# Patient Record
Sex: Female | Born: 1970 | Race: White | Hispanic: No | Smoking: Former smoker
Health system: Southern US, Community
[De-identification: ages and names within clinical notes are randomized; demographics above are authoritative.]

## PROBLEM LIST (undated history)

## (undated) DIAGNOSIS — R87619 Unspecified abnormal cytological findings in specimens from cervix uteri: Secondary | ICD-10-CM

## (undated) DIAGNOSIS — IMO0002 Reserved for concepts with insufficient information to code with codable children: Secondary | ICD-10-CM

## (undated) DIAGNOSIS — N809 Endometriosis, unspecified: Secondary | ICD-10-CM

## (undated) DIAGNOSIS — Z8619 Personal history of other infectious and parasitic diseases: Secondary | ICD-10-CM

## (undated) DIAGNOSIS — Z87442 Personal history of urinary calculi: Secondary | ICD-10-CM

## (undated) DIAGNOSIS — D332 Benign neoplasm of brain, unspecified: Secondary | ICD-10-CM

## (undated) DIAGNOSIS — B009 Herpesviral infection, unspecified: Secondary | ICD-10-CM

## (undated) DIAGNOSIS — R51 Headache: Secondary | ICD-10-CM

## (undated) DIAGNOSIS — T8339XA Other mechanical complication of intrauterine contraceptive device, initial encounter: Secondary | ICD-10-CM

## (undated) HISTORY — DX: Headache: R51

## (undated) HISTORY — DX: Other mechanical complication of intrauterine contraceptive device, initial encounter: T83.39XA

## (undated) HISTORY — PX: BRAIN SURGERY: SHX531

## (undated) HISTORY — PX: WISDOM TOOTH EXTRACTION: SHX21

## (undated) HISTORY — DX: Herpesviral infection, unspecified: B00.9

## (undated) HISTORY — DX: Personal history of urinary calculi: Z87.442

## (undated) HISTORY — PX: APPENDECTOMY: SHX54

## (undated) HISTORY — DX: Endometriosis, unspecified: N80.9

## (undated) HISTORY — DX: Personal history of other infectious and parasitic diseases: Z86.19

## (undated) HISTORY — DX: Reserved for concepts with insufficient information to code with codable children: IMO0002

## (undated) HISTORY — PX: LAPAROTOMY: SHX154

## (undated) HISTORY — DX: Unspecified abnormal cytological findings in specimens from cervix uteri: R87.619

---

## 2003-05-18 ENCOUNTER — Emergency Department (HOSPITAL_COMMUNITY): Admission: EM | Admit: 2003-05-18 | Discharge: 2003-05-18 | Payer: Self-pay | Admitting: Emergency Medicine

## 2003-08-19 ENCOUNTER — Other Ambulatory Visit: Admission: RE | Admit: 2003-08-19 | Discharge: 2003-08-19 | Payer: Self-pay | Admitting: Obstetrics and Gynecology

## 2004-08-19 ENCOUNTER — Other Ambulatory Visit: Admission: RE | Admit: 2004-08-19 | Discharge: 2004-08-19 | Payer: Self-pay | Admitting: Obstetrics and Gynecology

## 2004-11-28 ENCOUNTER — Emergency Department (HOSPITAL_COMMUNITY): Admission: EM | Admit: 2004-11-28 | Discharge: 2004-11-28 | Payer: Self-pay | Admitting: Emergency Medicine

## 2005-08-20 ENCOUNTER — Other Ambulatory Visit: Admission: RE | Admit: 2005-08-20 | Discharge: 2005-08-20 | Payer: Self-pay | Admitting: Obstetrics and Gynecology

## 2006-03-30 ENCOUNTER — Emergency Department (HOSPITAL_COMMUNITY): Admission: EM | Admit: 2006-03-30 | Discharge: 2006-03-31 | Payer: Self-pay | Admitting: Emergency Medicine

## 2006-03-31 ENCOUNTER — Emergency Department (HOSPITAL_COMMUNITY): Admission: EM | Admit: 2006-03-31 | Discharge: 2006-03-31 | Payer: Self-pay | Admitting: Emergency Medicine

## 2006-03-31 ENCOUNTER — Inpatient Hospital Stay (HOSPITAL_COMMUNITY): Admission: EM | Admit: 2006-03-31 | Discharge: 2006-04-01 | Payer: Self-pay | Admitting: Emergency Medicine

## 2006-04-04 ENCOUNTER — Ambulatory Visit (HOSPITAL_COMMUNITY): Admission: RE | Admit: 2006-04-04 | Discharge: 2006-04-04 | Payer: Self-pay | Admitting: Urology

## 2007-09-13 ENCOUNTER — Emergency Department (HOSPITAL_COMMUNITY): Admission: EM | Admit: 2007-09-13 | Discharge: 2007-09-13 | Payer: Self-pay | Admitting: Emergency Medicine

## 2008-02-29 ENCOUNTER — Ambulatory Visit (HOSPITAL_COMMUNITY): Admission: RE | Admit: 2008-02-29 | Discharge: 2008-02-29 | Payer: Self-pay | Admitting: Sports Medicine

## 2008-12-25 ENCOUNTER — Emergency Department (HOSPITAL_BASED_OUTPATIENT_CLINIC_OR_DEPARTMENT_OTHER): Admission: EM | Admit: 2008-12-25 | Discharge: 2008-12-25 | Payer: Self-pay | Admitting: Emergency Medicine

## 2008-12-25 ENCOUNTER — Ambulatory Visit: Payer: Self-pay | Admitting: Diagnostic Radiology

## 2008-12-29 ENCOUNTER — Encounter: Admission: RE | Admit: 2008-12-29 | Discharge: 2008-12-29 | Payer: Self-pay | Admitting: Family Medicine

## 2009-05-13 ENCOUNTER — Observation Stay (HOSPITAL_COMMUNITY): Admission: EM | Admit: 2009-05-13 | Discharge: 2009-05-15 | Payer: Self-pay | Admitting: Emergency Medicine

## 2009-05-15 ENCOUNTER — Encounter (INDEPENDENT_AMBULATORY_CARE_PROVIDER_SITE_OTHER): Payer: Self-pay | Admitting: Internal Medicine

## 2009-05-20 ENCOUNTER — Encounter: Admission: RE | Admit: 2009-05-20 | Discharge: 2009-05-20 | Payer: Self-pay | Admitting: Internal Medicine

## 2009-05-26 ENCOUNTER — Encounter: Admission: RE | Admit: 2009-05-26 | Discharge: 2009-08-24 | Payer: Self-pay | Admitting: Internal Medicine

## 2010-04-01 ENCOUNTER — Ambulatory Visit: Payer: Self-pay | Admitting: Licensed Clinical Social Worker

## 2010-04-07 ENCOUNTER — Ambulatory Visit: Payer: Self-pay | Admitting: Licensed Clinical Social Worker

## 2010-04-28 ENCOUNTER — Ambulatory Visit: Payer: Self-pay | Admitting: Licensed Clinical Social Worker

## 2010-05-05 ENCOUNTER — Ambulatory Visit: Payer: Self-pay | Admitting: Licensed Clinical Social Worker

## 2010-05-28 ENCOUNTER — Ambulatory Visit: Admit: 2010-05-28 | Payer: Self-pay | Admitting: Licensed Clinical Social Worker

## 2010-08-24 LAB — URINALYSIS, ROUTINE W REFLEX MICROSCOPIC
Bilirubin Urine: NEGATIVE
Glucose, UA: NEGATIVE mg/dL
Hgb urine dipstick: NEGATIVE
Ketones, ur: NEGATIVE mg/dL
Nitrite: NEGATIVE
Protein, ur: NEGATIVE mg/dL
Specific Gravity, Urine: 1.01 (ref 1.005–1.030)
Urobilinogen, UA: 0.2 mg/dL (ref 0.0–1.0)
pH: 6.5 (ref 5.0–8.0)

## 2010-08-24 LAB — GLUCOSE, CAPILLARY: Glucose-Capillary: 92 mg/dL (ref 70–99)

## 2010-08-24 LAB — CBC
HCT: 38.1 % (ref 36.0–46.0)
HCT: 40.6 % (ref 36.0–46.0)
Hemoglobin: 13.1 g/dL (ref 12.0–15.0)
Hemoglobin: 14.2 g/dL (ref 12.0–15.0)
MCHC: 34.2 g/dL (ref 30.0–36.0)
MCHC: 34.9 g/dL (ref 30.0–36.0)
MCHC: 34.9 g/dL (ref 30.0–36.0)
MCV: 94 fL (ref 78.0–100.0)
MCV: 94.3 fL (ref 78.0–100.0)
MCV: 95.7 fL (ref 78.0–100.0)
Platelets: 240 10*3/uL (ref 150–400)
Platelets: 244 10*3/uL (ref 150–400)
Platelets: 255 10*3/uL (ref 150–400)
RBC: 4.05 MIL/uL (ref 3.87–5.11)
RBC: 4.05 MIL/uL (ref 3.87–5.11)
RBC: 4.24 MIL/uL (ref 3.87–5.11)
RDW: 11.4 % — ABNORMAL LOW (ref 11.5–15.5)
RDW: 11.8 % (ref 11.5–15.5)
RDW: 12.1 % (ref 11.5–15.5)
WBC: 5 10*3/uL (ref 4.0–10.5)
WBC: 5.6 10*3/uL (ref 4.0–10.5)

## 2010-08-24 LAB — VITAMIN B12: Vitamin B-12: 514 pg/mL (ref 211–911)

## 2010-08-24 LAB — COMPREHENSIVE METABOLIC PANEL
ALT: 19 U/L (ref 0–35)
AST: 18 U/L (ref 0–37)
Albumin: 4 g/dL (ref 3.5–5.2)
Alkaline Phosphatase: 49 U/L (ref 39–117)
BUN: 9 mg/dL (ref 6–23)
CO2: 21 mEq/L (ref 19–32)
Calcium: 8.9 mg/dL (ref 8.4–10.5)
Chloride: 112 mEq/L (ref 96–112)
Creatinine, Ser: 0.58 mg/dL (ref 0.4–1.2)
GFR calc Af Amer: >60 mL/min (ref >60)
GFR calc non Af Amer: >60 mL/min (ref >60)
Glucose, Bld: 125 mg/dL — ABNORMAL HIGH (ref 70–99)
Potassium: 3.3 mEq/L — ABNORMAL LOW (ref 3.5–5.1)
Sodium: 138 mEq/L (ref 135–145)
Total Bilirubin: 1 mg/dL (ref 0.3–1.2)
Total Protein: 6.4 g/dL (ref 6.0–8.3)

## 2010-08-24 LAB — DIFFERENTIAL
Basophils Absolute: 0.1 10*3/uL (ref 0.0–0.1)
Basophils Relative: 1 % (ref 0–1)
Eosinophils Absolute: 0.2 10*3/uL (ref 0.0–0.7)
Eosinophils Relative: 3 % (ref 0–5)
Lymphocytes Relative: 42 % (ref 12–46)
Lymphs Abs: 2.3 10*3/uL (ref 0.7–4.0)
Monocytes Absolute: 0.4 10*3/uL (ref 0.1–1.0)
Monocytes Relative: 6 % (ref 3–12)
Neutro Abs: 2.6 10*3/uL (ref 1.7–7.7)
Neutrophils Relative %: 48 % (ref 43–77)

## 2010-08-24 LAB — PROTIME-INR
INR: 1.11 (ref 0.00–1.49)
Prothrombin Time: 14.2 seconds (ref 11.6–15.2)

## 2010-08-24 LAB — BASIC METABOLIC PANEL
BUN: 11 mg/dL (ref 6–23)
BUN: 9 mg/dL (ref 6–23)
CO2: 20 mEq/L (ref 19–32)
CO2: 21 mEq/L (ref 19–32)
Calcium: 8.5 mg/dL (ref 8.4–10.5)
Chloride: 109 mEq/L (ref 96–112)
Chloride: 110 mEq/L (ref 96–112)
Creatinine, Ser: 0.59 mg/dL (ref 0.4–1.2)
Creatinine, Ser: 0.81 mg/dL (ref 0.4–1.2)
GFR calc Af Amer: >60 mL/min (ref >60)
GFR calc non Af Amer: >60 mL/min (ref >60)
Glucose, Bld: 91 mg/dL (ref 70–99)
Glucose, Bld: 98 mg/dL (ref 70–99)
Potassium: 3.5 mEq/L (ref 3.5–5.1)
Sodium: 136 mEq/L (ref 135–145)

## 2010-08-24 LAB — URINE CULTURE
Colony Count: NO GROWTH
Culture: NO GROWTH
Special Requests: NEGATIVE

## 2010-08-24 LAB — CK TOTAL AND CKMB (NOT AT ARMC)
CK, MB: 0.4 ng/mL (ref 0.3–4.0)
Relative Index: INVALID (ref 0.0–2.5)
Total CK: 73 U/L (ref 7–177)

## 2010-08-24 LAB — APTT: aPTT: 28 seconds (ref 24–37)

## 2010-08-24 LAB — TROPONIN I: Troponin I: 0.01 ng/mL (ref 0.00–0.06)

## 2010-08-29 LAB — COMPREHENSIVE METABOLIC PANEL
ALT: 25 U/L (ref 0–35)
AST: 22 U/L (ref 0–37)
Albumin: 4.6 g/dL (ref 3.5–5.2)
Alkaline Phosphatase: 57 U/L (ref 39–117)
BUN: 15 mg/dL (ref 6–23)
CO2: 24 mEq/L (ref 19–32)
Calcium: 9.9 mg/dL (ref 8.4–10.5)
Chloride: 106 mEq/L (ref 96–112)
Creatinine, Ser: 0.7 mg/dL (ref 0.4–1.2)
GFR calc Af Amer: >60 mL/min (ref >60)
GFR calc non Af Amer: >60 mL/min (ref >60)
Glucose, Bld: 112 mg/dL — ABNORMAL HIGH (ref 70–99)
Potassium: 4 mEq/L (ref 3.5–5.1)
Sodium: 145 mEq/L (ref 135–145)
Total Bilirubin: 0.4 mg/dL (ref 0.3–1.2)
Total Protein: 7.6 g/dL (ref 6.0–8.3)

## 2010-08-29 LAB — CBC
HCT: 41.9 % (ref 36.0–46.0)
Hemoglobin: 14.5 g/dL (ref 12.0–15.0)
MCHC: 34.7 g/dL (ref 30.0–36.0)
MCV: 95.2 fL (ref 78.0–100.0)
Platelets: 296 10*3/uL (ref 150–400)
RBC: 4.4 MIL/uL (ref 3.87–5.11)
RDW: 11.4 % — ABNORMAL LOW (ref 11.5–15.5)
WBC: 7.1 10*3/uL (ref 4.0–10.5)

## 2010-08-29 LAB — URINE MICROSCOPIC-ADD ON

## 2010-08-29 LAB — URINALYSIS, ROUTINE W REFLEX MICROSCOPIC
Bilirubin Urine: NEGATIVE
Glucose, UA: NEGATIVE mg/dL
Hgb urine dipstick: NEGATIVE
Ketones, ur: NEGATIVE mg/dL
Nitrite: NEGATIVE
Protein, ur: NEGATIVE mg/dL
Specific Gravity, Urine: 1.01 (ref 1.005–1.030)
Urobilinogen, UA: 0.2 mg/dL (ref 0.0–1.0)
pH: 7 (ref 5.0–8.0)

## 2010-08-29 LAB — DIFFERENTIAL
Eosinophils Relative: 0 % (ref 0–5)
Lymphocytes Relative: 11 % — ABNORMAL LOW (ref 12–46)
Lymphs Abs: 0.8 10*3/uL (ref 0.7–4.0)
Monocytes Absolute: 0 10*3/uL — ABNORMAL LOW (ref 0.1–1.0)

## 2010-10-09 NOTE — Op Note (Signed)
Robin Bruce, Robin Bruce               ACCOUNT NO.:  [id number]   MEDICAL RECORD NO.:  [id number]           PATIENT TYPE:   LOCATION:                                 FACILITY:   PHYSICIAN:  Maretta Bees. Vonita Moss, M.D.     DATE OF BIRTH:   DATE OF PROCEDURE:  DATE OF DISCHARGE:                                 OPERATIVE REPORT   PREOPERATIVE DIAGNOSIS:  Severe right flank pain due to obstructing right  UPJ stone.   POSTOP DIAGNOSIS:  Severe right flank pain due to obstructing right UPJ  stone.   PROCEDURES:  1. Cystoscopy.  2. Right retrograde pyelogram with interpretation.  3. Right double-J catheter insertion.   SURGEON:  Maretta Bees. Vonita Moss, M.D.   ANESTHESIA:  General.   INDICATIONS:  This 40 year old lady with a past history of stone disease  presented, yesterday, at the office with severe right flank pain due to a 6-  mm stone in the right UPJ and a nonobstructing stone left kidney.  She had  to be admitted to the hospital last night for severe pain, nausea, and  vomiting.  She had a low potassium that was corrected with IV fluids.  She  is still having significant pain, today, and is scheduled for lithotripsy on  Monday, 11/12.  I discussed the options with her; and we both felt she  needed pain relief with the double-J catheter over the weekend.  I advised  her about the risk of some flank pain, hematuria, bladder pressure; and she  is brought to the OR today.   DESCRIPTION OF PROCEDURE:  The patient was brought to the operating room and  placed in the lithotomy position.  External genitalia were prepped and  draped in the usual fashion.  She was cystoscoped and bladder was normal.  I  used an 5-French open-ended ureteral catheter; and through that, inserted a  metal soft tip double-J catheter; and it would not go past the stone; so I  inserted a Glidewire through the double-J catheter, and after some twisting  of the catheter, it went past the stone into the renal collecting  system.   I then performed a right retrograde pyelogram by advancing the open-ended  ureteral catheter per cystoscope over the Glidewire into the renal  collecting system.  Slightly blood tinged urine drained out; and I performed  a retrograde pyelogram that showed pyelocaliectasis.   A metal guidewire was then placed through the open-ended ureteral catheter  into the renal collecting system; and over this guidewire a 6-French, 26-cm,  double-J catheter was passed and successfully placed with a full coil in the  upper caliceal system, and a full coil in the bladder.  At this point the  bladder was emptied, cystoscope removed; and the patient sent to the  recovery room in good condition having tolerated the procedure well.      Maretta Bees. Vonita Moss, M.D.  Electronically Signed     LJP/MEDQ  D:  04/01/2006  T:  04/01/2006  Job:  [id number]

## 2011-01-13 ENCOUNTER — Other Ambulatory Visit: Payer: Self-pay | Admitting: Obstetrics and Gynecology

## 2011-01-13 DIAGNOSIS — Z1231 Encounter for screening mammogram for malignant neoplasm of breast: Secondary | ICD-10-CM

## 2011-01-20 ENCOUNTER — Ambulatory Visit: Payer: Self-pay

## 2011-01-28 ENCOUNTER — Ambulatory Visit
Admission: RE | Admit: 2011-01-28 | Discharge: 2011-01-28 | Disposition: A | Discharge status: Home / Self Care | Payer: BC Managed Care – PPO | Source: Ambulatory Visit | Attending: Obstetrics and Gynecology | Admitting: Obstetrics and Gynecology

## 2011-01-28 DIAGNOSIS — Z1231 Encounter for screening mammogram for malignant neoplasm of breast: Secondary | ICD-10-CM

## 2011-02-16 LAB — POCT PREGNANCY, URINE
Operator id: 285841
Preg Test, Ur: NEGATIVE

## 2011-02-16 LAB — COMPREHENSIVE METABOLIC PANEL
ALT: 22
AST: 22
CO2: 24
Calcium: 9.7
Chloride: 103
GFR calc Af Amer: >60
GFR calc non Af Amer: >60
Sodium: 137
Total Bilirubin: 0.5

## 2011-02-16 LAB — URINALYSIS, ROUTINE W REFLEX MICROSCOPIC
Bilirubin Urine: NEGATIVE
Hgb urine dipstick: NEGATIVE
Ketones, ur: NEGATIVE
Nitrite: NEGATIVE
pH: 5

## 2011-02-16 LAB — CBC
RBC: 4.53
WBC: 8.6

## 2011-02-16 LAB — LIPASE, BLOOD: Lipase: 17

## 2011-02-16 LAB — DIFFERENTIAL
Eosinophils Absolute: 0.1
Eosinophils Relative: 1
Lymphs Abs: 1.2

## 2011-02-16 LAB — URINE MICROSCOPIC-ADD ON

## 2011-06-02 IMAGING — CT CT HEAD W/O CM
1 series · 16 of 30 positions shown, 20 images · non-contrast
Comparison: None

CLINICAL DATA: Dizziness.  Weakness.  Numbness of the arms and
legs.  Previous cavernous hemangioma resection.

CT HEAD WITHOUT CONTRAST
TECHNIQUE: Contiguous axial images were obtained from the base of
the skull through the vertex without contrast.

[Series 2: head 4.8 h37s · axial · 0.46mm/px · z∈[-28,+108]mm · 16 of 32 slices shown, 20 images]
[im 2/32  brain]
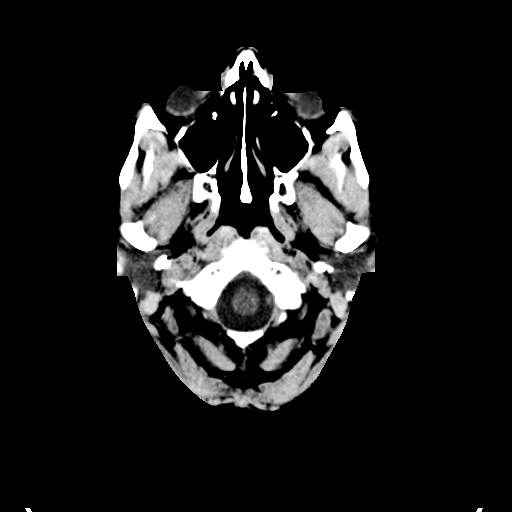
[im 2/32  bone]
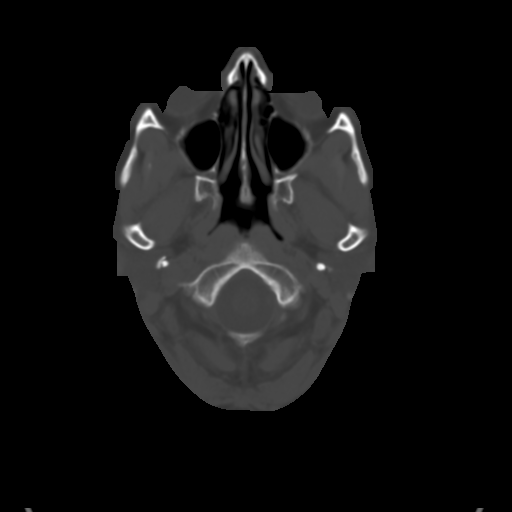
[im 4/32  brain]
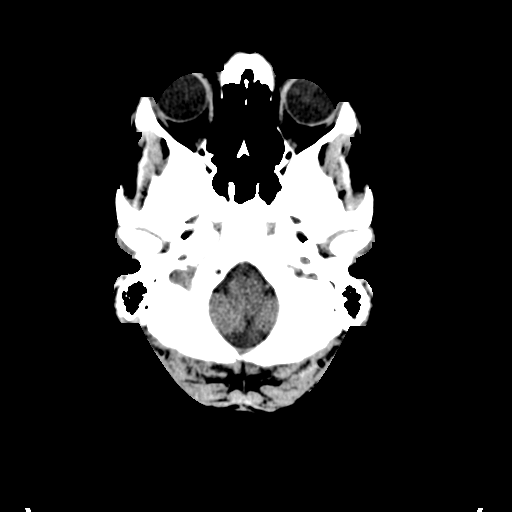
[im 6/32  brain]
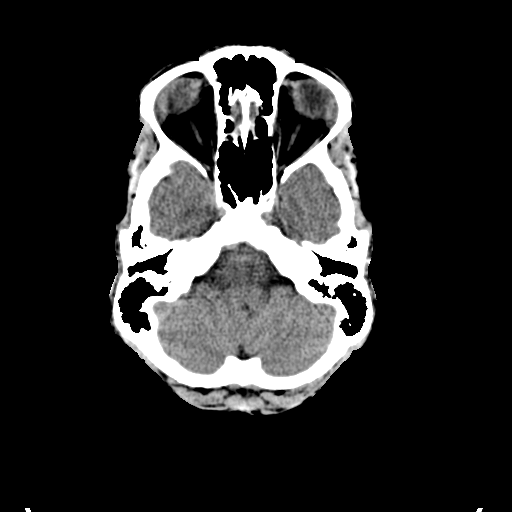
[im 8/32  brain]
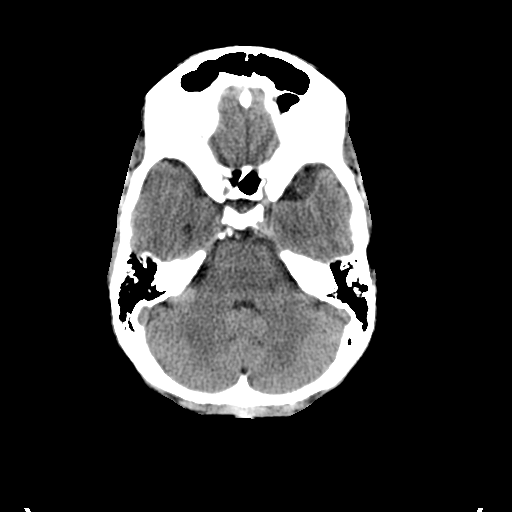
[im 9/32  brain]
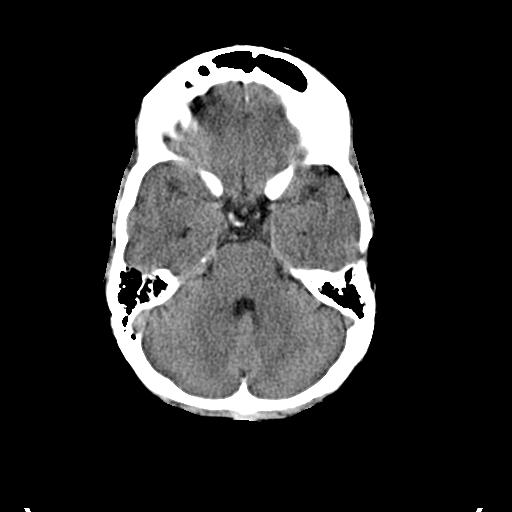
[im 9/32  bone]
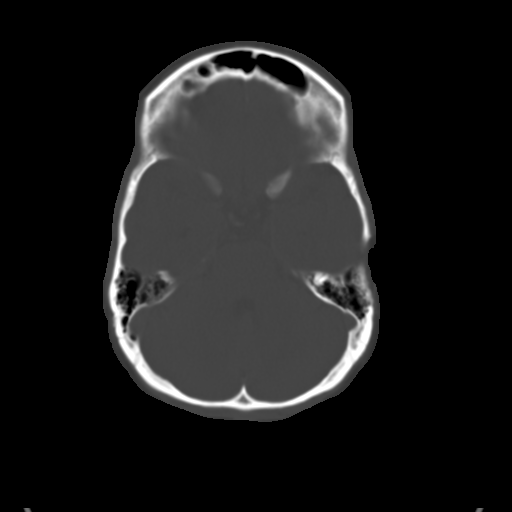
[im 11/32  brain]
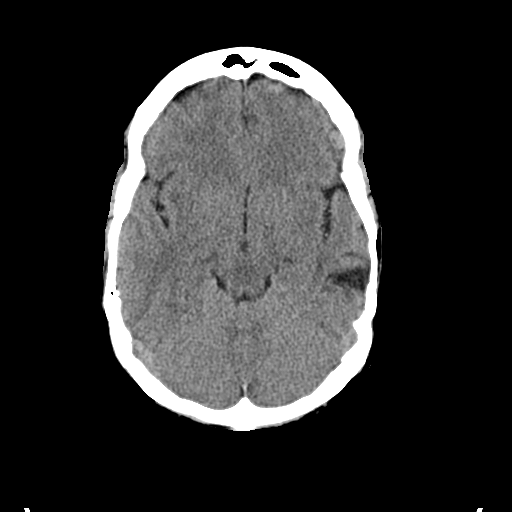
[im 13/32  brain]
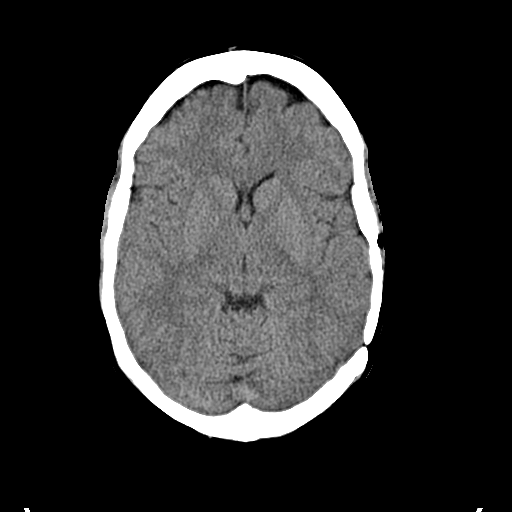
[im 15/32  brain]
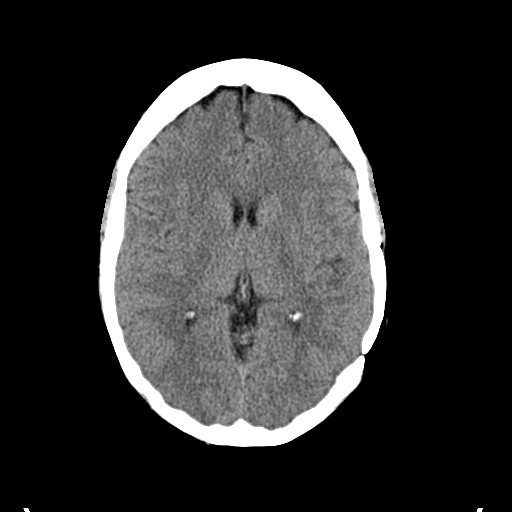
[im 17/32  brain]
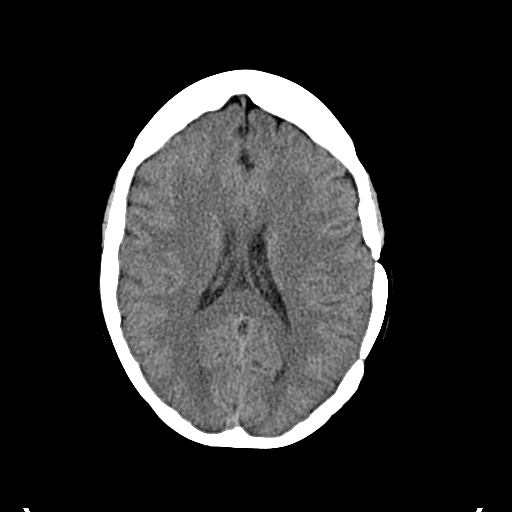
[im 17/32  bone]
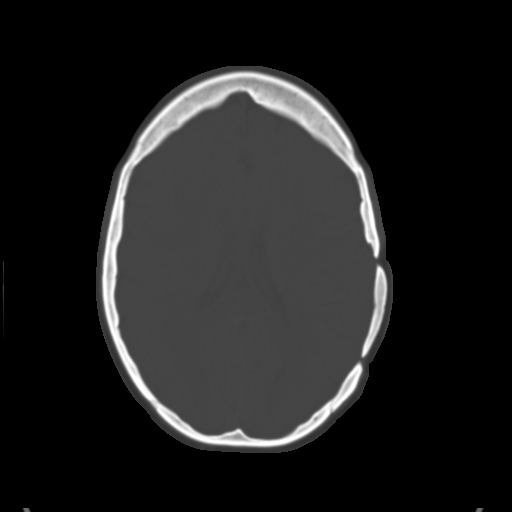
[im 19/32  brain]
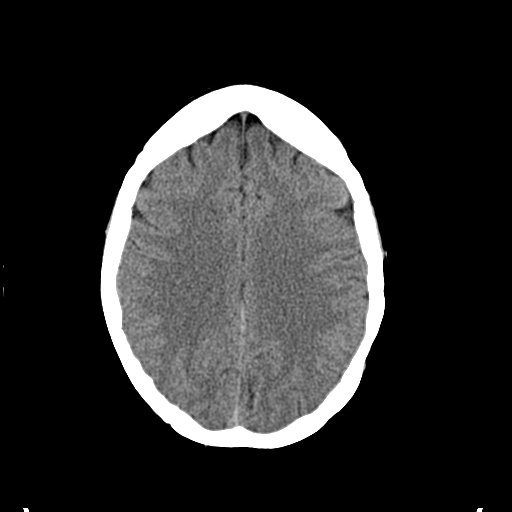
[im 21/32  brain]
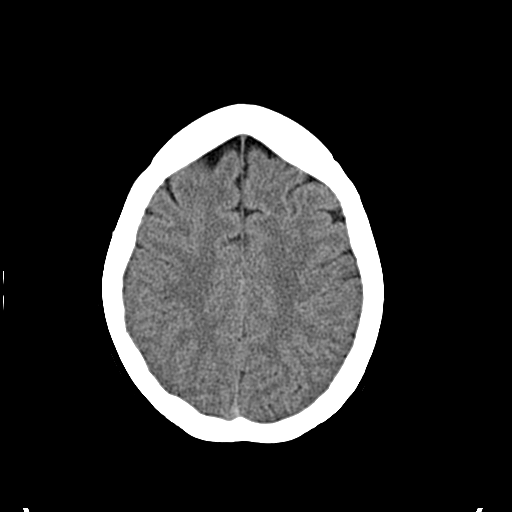
[im 23/32  brain]
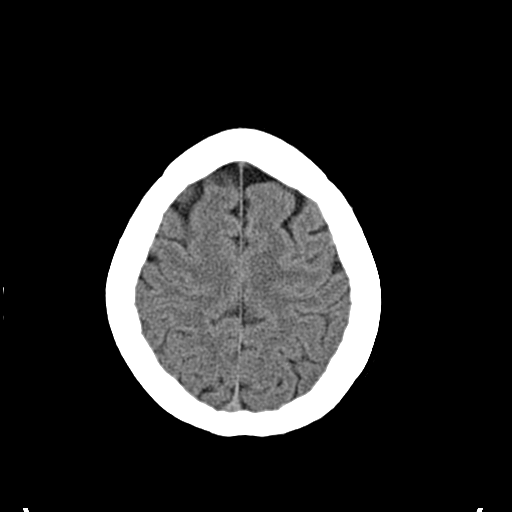
[im 24/32  brain]
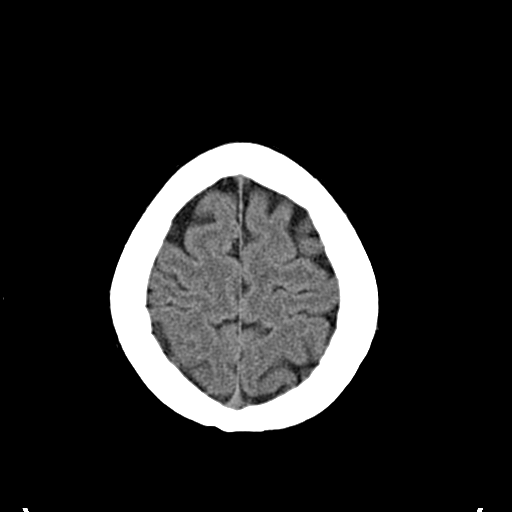
[im 24/32  bone]
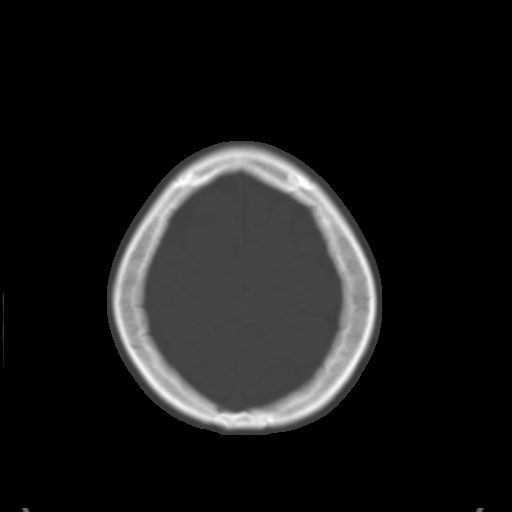
[im 26/32  brain]
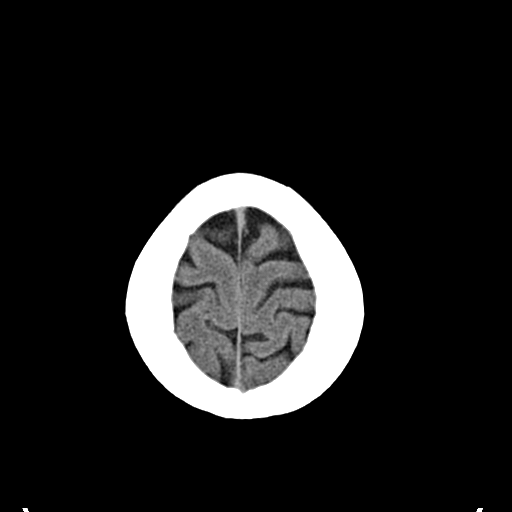
[im 28/32  brain]
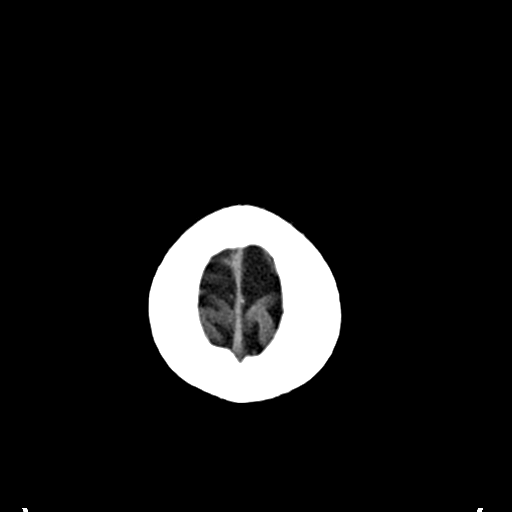
[im 30/32  brain]
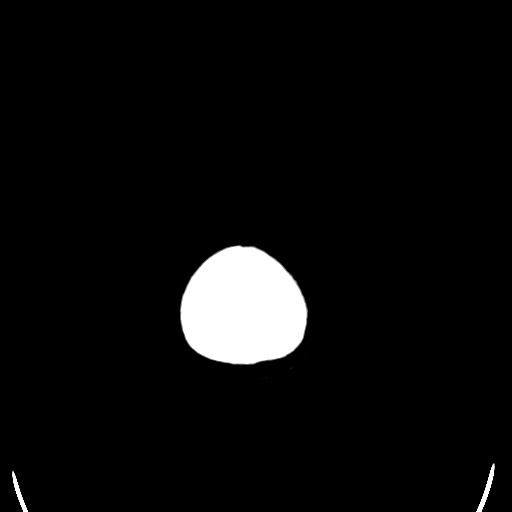

[16 of 30 positions shown; findings below may reference images not displayed]

FINDINGS: There has been previous left parietal craniotomy.  There
is focal encephalomalacia in the lateral left temporal lobe,
probably relating to previous surgery.  The remainder the brain
appears normal.  No evidence of acute infarction, mass lesion,
hemorrhage, hydrocephalus or extra-axial collection.  The paranasal
sinuses, middle ears and mastoids are clear.
IMPRESSION: Previous left sided craniotomy.  Post resective encephalomalacia
along the lateral left temporal lobe.  No other intracranial
finding.

## 2011-07-27 ENCOUNTER — Other Ambulatory Visit: Payer: BC Managed Care – PPO

## 2011-07-29 ENCOUNTER — Other Ambulatory Visit (INDEPENDENT_AMBULATORY_CARE_PROVIDER_SITE_OTHER): Payer: BC Managed Care – PPO

## 2011-07-29 DIAGNOSIS — Z3040 Encounter for surveillance of contraceptives, unspecified: Secondary | ICD-10-CM

## 2011-08-05 ENCOUNTER — Encounter: Payer: Self-pay | Admitting: Obstetrics and Gynecology

## 2011-09-28 ENCOUNTER — Ambulatory Visit (INDEPENDENT_AMBULATORY_CARE_PROVIDER_SITE_OTHER): Payer: BC Managed Care – PPO | Admitting: Obstetrics and Gynecology

## 2011-09-28 ENCOUNTER — Encounter: Payer: Self-pay | Admitting: Obstetrics and Gynecology

## 2011-09-28 VITALS — BP 102/68 | Ht 65.5 in | Wt 144.0 lb

## 2011-09-28 DIAGNOSIS — N926 Irregular menstruation, unspecified: Secondary | ICD-10-CM

## 2011-09-28 LAB — GC/CHLAMYDIA PROBE AMP, GENITAL
Chlamydia, DNA Probe: NEGATIVE
GC Probe Amp, Genital: NEGATIVE

## 2011-09-28 NOTE — Progress Notes (Signed)
Contraception: yes Fibroids: no Hormone Therapy: no New Medications: no Menopausal Symptoms: no Vag. Discharge: no Abdominal Pain: yes Increased Stress: no Pt c/o abnormal bleeding and cramping after depo injection.  Robin Bruce, Charetha Pt reports she has abnormal bleeding. passing small clots  It has been going on for 2 weeks just after last depo shot   Pt has dysmenorrhayes  Her pain is a 7/10.  Bleeding has now stopped.   She uses 1 of tampons or pads every 3 hours.  .  Pt denies chest pain or SOB Physical Examination: General appearance - alert, well appearing, and in no distress Mental status - alert, oriented to person, place, and time Chest - clear to auscultation, no wheezes, rales or rhonchi, symmetric air entry Heart - normal rate and regular rhythm Abdomen - soft, nontender, nondistended, no masses or organomegaly Pelvic - normal external genitalia, vulva, vagina, cervix, uterus and adnexa.  Mild uterine tenderness and left adnexal tenderness Back exam - full range of motion, no tenderness, palpable spasm or pain on motion Musculoskeletal - no joint tenderness, deformity or swelling Extremities - peripheral pulses normal, no pedal edema, no clubbing or cyanosis Skin - normal coloration and turgor, no rashes, no suspicious skin lesions Irregular vb Check gc and chlamydia cultures [place] wth possible embx @[place]  Pt with osteopenia recommend to try different bc @[facility] 

## 2011-10-05 ENCOUNTER — Ambulatory Visit (INDEPENDENT_AMBULATORY_CARE_PROVIDER_SITE_OTHER): Payer: BC Managed Care – PPO

## 2011-10-05 ENCOUNTER — Other Ambulatory Visit: Payer: Self-pay | Admitting: Obstetrics and Gynecology

## 2011-10-05 ENCOUNTER — Encounter: Payer: BC Managed Care – PPO | Admitting: Obstetrics and Gynecology

## 2011-10-05 DIAGNOSIS — N926 Irregular menstruation, unspecified: Secondary | ICD-10-CM

## 2011-10-29 ENCOUNTER — Other Ambulatory Visit: Payer: BC Managed Care – PPO

## 2011-10-29 ENCOUNTER — Other Ambulatory Visit (INDEPENDENT_AMBULATORY_CARE_PROVIDER_SITE_OTHER): Payer: BC Managed Care – PPO

## 2011-10-29 DIAGNOSIS — Z3009 Encounter for other general counseling and advice on contraception: Secondary | ICD-10-CM

## 2011-10-29 MED ORDER — MEDROXYPROGESTERONE ACETATE 150 MG/ML IM SUSP
150.0000 mg | Freq: Once | INTRAMUSCULAR | Status: AC
  Administered 2011-10-29: 150 mg via INTRAMUSCULAR

## 2011-10-29 NOTE — Progress Notes (Signed)
Addended by: Larwance Rote on: 10/29/2011 10:39 AM   Modules accepted: Level of Service

## 2012-01-20 ENCOUNTER — Other Ambulatory Visit: Payer: BC Managed Care – PPO

## 2012-01-27 ENCOUNTER — Other Ambulatory Visit: Payer: Self-pay | Admitting: Obstetrics and Gynecology

## 2012-02-28 ENCOUNTER — Ambulatory Visit (INDEPENDENT_AMBULATORY_CARE_PROVIDER_SITE_OTHER): Payer: BC Managed Care – PPO | Admitting: Obstetrics and Gynecology

## 2012-02-28 ENCOUNTER — Encounter: Payer: Self-pay | Admitting: Obstetrics and Gynecology

## 2012-02-28 VITALS — BP 110/70 | HR 64 | Ht 65.5 in | Wt 150.0 lb

## 2012-02-28 DIAGNOSIS — Z309 Encounter for contraceptive management, unspecified: Secondary | ICD-10-CM

## 2012-02-28 DIAGNOSIS — IMO0001 Reserved for inherently not codable concepts without codable children: Secondary | ICD-10-CM

## 2012-02-28 DIAGNOSIS — Z124 Encounter for screening for malignant neoplasm of cervix: Secondary | ICD-10-CM

## 2012-02-28 MED ORDER — LEVONORGEST-ETH ESTRAD 91-DAY 0.15-0.03 &0.01 MG PO TABS: 1.0000 | ORAL_TABLET | Freq: Every day | ORAL | Status: DC

## 2012-02-28 NOTE — Patient Instructions (Signed)
Contraception Choices  Birth control (contraception) can stop pregnancy from happening. Different types of birth control work in different ways. Some can:  · Make the mucus in the cervix thick. This makes it hard for sperm to get into the uterus.  · Thin the lining of the uterus. This makes it hard for an egg to attach to the wall of the uterus.  · Stop the ovaries from releasing an egg.  · Block the sperm from reaching the egg.  Certain types of surgery can stop pregnancy from happening. For women, the sugery closes the fallopian tubes (tubal ligation). For men, the surgery stops sperm from releasing during sex (vasectomy).  HORMONAL BIRTH CONTROL  Hormonal birth control stops pregnancy by putting hormones into your body. Types of birth control include:  · A small tube put under the skin of the upper arm (implant). The tube can stay in place for 3 years.  · Shots given every 3 months.  · Pills taken every day or once after sex (intercourse).  · Patches that are changed once a week.  · A ring put into the vagina (vaginal ring). The ring is left in place for 3 weeks and removed for 1 week. Then, a new ring is put in the vagina.  BARRIER BIRTH CONTROL   Barrier birth control blocks sperm from reaching the egg. Types of birth control include:   · A thin covering worn on the penis (female condom) during sex.  · A soft, loose covering put into the vagina (female condom) before sex.  · A rubber bowl that sits over the cervix (diaphragm). The bowl must be made for you. The bowl is put into the vagina before sex. The bowl is left in place for 6 to 8 hours after sex.  · A small, soft cup that fits over the cervix (cervical cap). The cup must be made for you. The cup can be left in place for 48 hours after sex.  · A sponge that is put into the vagina before sex.  · A chemical that kills or blocks sperm from getting into the cervix and uterus (spermicide). The chemical may be a cream, jelly, foam, or pill.  INTRAUTERINE (IUD)  BIRTH CONTROL   IUD birth control is a small, T-shaped piece of plastic. The plastic is put inside the uterus. There are 2 types of IUD:  · Copper IUD. The IUD is covered in copper wire. The copper makes a fluid that kills sperm. It can stay in place for 10 years.  · Hormone IUD. The hormone stops pregnancy from happening. It can stay in place for 5 years.  NATURAL FAMILY PLANNING BIRTH CONTROL   Natural family planning means not having sex or using barrier birth control when the woman is fertile. A woman can:  · Use a calendar to keep track of when she is fertile.  · Use a thermometer to measure her body temperature.  Protect yourself against sexual diseases no matter what type of birth control you use. Talk to your doctor about which type of birth control is best for you.  Document Released: 03/07/2009 Document Revised: 08/02/2011 Document Reviewed: 09/16/2010  [employer]® Patient Information ©2013 [employer].

## 2012-02-28 NOTE — Progress Notes (Signed)
Last Pap: 01/13/12 WNL: Yes Regular Periods:no Contraception: depo injection  Monthly Breast exam:no Tetanus<54yrs:no Nl.Bladder Function:yes Daily BMs:yes Healthy Diet:yes Calcium:yes Mammogram:yes Date of Mammogram: 01/28/11 Exercise:yes Have often Exercise: usually 2-3 times per week  Seatbelt: yes Abuse at home: no Stressful work:no Sigmoid-colonoscopy: n/a Bone Density: Yes 02/11/11 PCP: Dr. Joslyn Hy  Change in PMH: none Change in YNW:GNFA Pt with several concerns BP 110/70  Pulse 64  Ht 5' 5.5" (1.664 m)  Wt 150 lb (68.04 kg)  BMI 24.58 kg/m2 Pt with complaints:no and yes she desires to change her BC.  She had rectal bleeding for one week.  It has now resolved no pain.   Physical Examination: General appearance - alert, well appearing, and in no distress Mental status - normal mood, behavior, speech, dress, motor activity, and thought processes Neck - supple, no significant adenopathy,  thyroid exam: thyroid is normal in size without nodules or tenderness Chest - clear to auscultation, no wheezes, rales or rhonchi, symmetric air entry Heart - normal rate and regular rhythm Abdomen - soft, nontender, nondistended, no masses or organomegaly Breasts - breasts appear normal, no suspicious masses, no skin or nipple changes or axillary nodes.  Left breast with superficial about 5mm cyst.   Pelvic - normal external genitalia, vulva, vagina, cervix, uterus and adnexa Rectal - normal rectal, no masses, external hemorrhoids non-thrombosed Back exam - full range of motion, no tenderness, palpable spasm or pain on motion Neurological - alert, oriented, normal speech, no focal findings or movement disorder noted Musculoskeletal - no joint tenderness, deformity or swelling Extremities - no edema, redness or tenderness in the calves or thighs Skin - normal coloration and turgor, no rashes, no suspicious skin lesions noted Routine exam Contraceptive management Left breast cyst Pap  sent yes Mammogram due no pt wants to do it next year. Pt States cyst has been present for four years and needs no f/u pt chose seasonique  used for contraception. Take two pills today if UPT negative.  Pt had no unprotected IC for two weeks Told pt not to smoke not even sociallly RT one year

## 2012-02-29 LAB — PAP IG W/ RFLX HPV ASCU

## 2012-04-17 ENCOUNTER — Telehealth: Payer: Self-pay | Admitting: Obstetrics and Gynecology

## 2012-04-17 NOTE — Telephone Encounter (Signed)
Pt is currently taking seasonique. Wanting to change BC. States that she is having depression, also having an increase in anxiety. States that she was prescribed xanax by her PCP for this. Wanting to know what is a reccommended BC pill or if she needs to have apt.  [name]

## 2012-04-18 NOTE — Telephone Encounter (Signed)
Pt needs to make an appointment

## 2012-04-18 NOTE — Telephone Encounter (Signed)
Apt scheduled 05/01/2012 with ND Ninah Moccio, Herbert Seta

## 2012-05-01 ENCOUNTER — Encounter: Payer: BC Managed Care – PPO | Admitting: Obstetrics and Gynecology

## 2012-05-10 ENCOUNTER — Telehealth: Payer: Self-pay | Admitting: Obstetrics and Gynecology

## 2012-05-11 ENCOUNTER — Encounter: Payer: Self-pay | Admitting: Obstetrics and Gynecology

## 2012-05-11 ENCOUNTER — Ambulatory Visit (INDEPENDENT_AMBULATORY_CARE_PROVIDER_SITE_OTHER): Payer: BC Managed Care – PPO | Admitting: Obstetrics and Gynecology

## 2012-05-11 VITALS — BP 130/82 | Temp 99.1°F | Wt 157.0 lb

## 2012-05-11 DIAGNOSIS — Z309 Encounter for contraceptive management, unspecified: Secondary | ICD-10-CM

## 2012-05-11 DIAGNOSIS — T8339XA Other mechanical complication of intrauterine contraceptive device, initial encounter: Secondary | ICD-10-CM | POA: Insufficient documentation

## 2012-05-11 DIAGNOSIS — N949 Unspecified condition associated with female genital organs and menstrual cycle: Secondary | ICD-10-CM

## 2012-05-11 DIAGNOSIS — R102 Pelvic and perineal pain: Secondary | ICD-10-CM

## 2012-05-11 DIAGNOSIS — N809 Endometriosis, unspecified: Secondary | ICD-10-CM | POA: Insufficient documentation

## 2012-05-11 DIAGNOSIS — Z3009 Encounter for other general counseling and advice on contraception: Secondary | ICD-10-CM

## 2012-05-11 LAB — POCT URINALYSIS DIPSTICK
Bilirubin, UA: NEGATIVE
Blood, UA: NEGATIVE
Glucose, UA: NEGATIVE
Leukocytes, UA: NEGATIVE
Nitrite, UA: NEGATIVE
Urobilinogen, UA: NEGATIVE

## 2012-05-11 MED ORDER — MEDROXYPROGESTERONE ACETATE 150 MG/ML IM SUSP: 150.0000 mg | Freq: Once | INTRAMUSCULAR | Status: DC

## 2012-05-11 MED ORDER — MEDROXYPROGESTERONE ACETATE 150 MG/ML IM SUSP
150.0000 mg | Freq: Once | INTRAMUSCULAR | Status: AC
  Administered 2012-05-11: 150 mg via INTRAMUSCULAR

## 2012-05-11 NOTE — Telephone Encounter (Signed)
LM FOR PT TO CALL BACK. 

## 2012-05-11 NOTE — Progress Notes (Signed)
Subjective:    Robin Bruce is a 41 y.o. female, [id number], who presents for Pelvic Pain. Pt states when she had her cycle it was very painful as well as a Bowel movement. Pt states when she would sit down she would feel a lot of pressure. Now on Day 25.  Pain started yesterday around 4:30 a.m. Pain on right side. States that it became progressively worse during the day. Pain got worse when pt had BM. Tylenol helped pain slightly. Pain is constant pain. Pain scale 5/10. During BM 7.5/10. No pain or soreness today. Had normal BM today without any pain. No fever, nausea, or vomiting. No pain with urination.  Pt was using Depo-Provera x 20 years without cycle. Last injection was 5-6 months ago. Decided to stop due to bone density. Decided to try taking a BC pill. Wishing to start using Depo-Provera again.  Contraception: none History of STD:  no history of PID, STD's History of ovarian cyst: yes:   History of fibroids: no History of endometriosis:yes:   Previous ultrasound:yes:   Urinary symptoms: none Gastro-intestinal symptoms:  Constipation: no "Pt states when she has a BM it is very painful"     Diarrhea: no     Nausea: no     Vomiting: no     Fever: no Vaginal discharge: no vaginal discharge  The following portions of the patient's history were reviewed and updated as appropriate: allergies, current medications, past family history.  Review of Systems Pertinent items are noted in HPI. Gastrointestinal: Negative for change in bowel habits or rectal bleeding Urinary:negative   Objective:    BP 130/82  Temp 99.1 F (37.3 C) (Oral)  Wt 157 lb (71.215 kg)  LMP 04/17/2012    Weight:  Wt Readings from Last 1 Encounters:  05/11/12 157 lb (71.215 kg)          BMI: There is no height on file to calculate BMI.  General Appearance: Alert, appropriate appearance for age. No acute distress HEENT: Grossly normal Neck / Thyroid: Supple, no masses, nodes or enlargement Lungs: clear to  auscultation bilaterally Back: No CVA tenderness Cardiovascular: Regular rate and rhythm. S1, S2, no murmur Gastrointestinal: Soft, non-tender, no masses or organomegaly. No guarding No rebound Pelvic Exam: Vulva and vagina appear normal. Bimanual exam reveals normal uterus and adnexa. Rectovaginal: not indicated    Assessment:    Normal gyn exam Probable ovulation pain now resolved    Plan:    return annually or prn Pain with Ovulation discussed Calcium and Vitamin D level reccommended   Depo-Provera was reviewed with the patient With expected benefits of: every 12 weeks utilization, high reliability at 99%, lack of estrogen and possible amenorrhea. Risks of weight gain, reversible bone loss and possible longer time to reverse ( 6 to 24 months) discussed Pt would like to resume.   Vitamin D level today   Silverio Lay, MD

## 2012-05-11 NOTE — Progress Notes (Signed)
Next Depo due 08-02-2012

## 2012-05-11 NOTE — Telephone Encounter (Signed)
Tc to Robin Bruce concerning pelvic pain. Robin Bruce states that she have appt schedule.

## 2012-05-12 LAB — VITAMIN D 25 HYDROXY (VIT D DEFICIENCY, FRACTURES): Vit D, 25-Hydroxy: 44 ng/mL (ref 30–89)

## 2012-07-24 ENCOUNTER — Other Ambulatory Visit: Payer: BC Managed Care – PPO

## 2014-04-24 ENCOUNTER — Other Ambulatory Visit: Payer: Self-pay | Admitting: Obstetrics and Gynecology

## 2014-04-24 DIAGNOSIS — Z1231 Encounter for screening mammogram for malignant neoplasm of breast: Secondary | ICD-10-CM

## 2014-04-30 ENCOUNTER — Ambulatory Visit: Payer: BC Managed Care – PPO

## 2014-05-07 ENCOUNTER — Ambulatory Visit
Admission: RE | Admit: 2014-05-07 | Discharge: 2014-05-07 | Disposition: A | Discharge status: Home / Self Care | Payer: BC Managed Care – PPO | Source: Ambulatory Visit | Attending: Obstetrics and Gynecology | Admitting: Obstetrics and Gynecology

## 2014-05-07 DIAGNOSIS — Z1231 Encounter for screening mammogram for malignant neoplasm of breast: Secondary | ICD-10-CM

## 2014-12-03 ENCOUNTER — Emergency Department (HOSPITAL_COMMUNITY)
Admission: EM | Admit: 2014-12-03 | Discharge: 2014-12-04 | Discharge status: Against Medical Advice | Payer: 59 | Source: Home / Self Care

## 2014-12-03 DIAGNOSIS — R109 Unspecified abdominal pain: Secondary | ICD-10-CM | POA: Insufficient documentation

## 2014-12-03 DIAGNOSIS — Z720 Tobacco use: Secondary | ICD-10-CM

## 2014-12-03 DIAGNOSIS — E860 Dehydration: Secondary | ICD-10-CM | POA: Insufficient documentation

## 2014-12-03 DIAGNOSIS — Z87442 Personal history of urinary calculi: Secondary | ICD-10-CM | POA: Diagnosis not present

## 2014-12-03 DIAGNOSIS — Z8742 Personal history of other diseases of the female genital tract: Secondary | ICD-10-CM | POA: Diagnosis not present

## 2014-12-03 DIAGNOSIS — R1013 Epigastric pain: Secondary | ICD-10-CM | POA: Diagnosis present

## 2014-12-03 DIAGNOSIS — Z9049 Acquired absence of other specified parts of digestive tract: Secondary | ICD-10-CM | POA: Diagnosis not present

## 2014-12-03 DIAGNOSIS — Z8619 Personal history of other infectious and parasitic diseases: Secondary | ICD-10-CM | POA: Diagnosis not present

## 2014-12-03 DIAGNOSIS — R1084 Generalized abdominal pain: Secondary | ICD-10-CM | POA: Diagnosis not present

## 2014-12-03 DIAGNOSIS — Z3202 Encounter for pregnancy test, result negative: Secondary | ICD-10-CM | POA: Diagnosis not present

## 2014-12-03 DIAGNOSIS — Z79899 Other long term (current) drug therapy: Secondary | ICD-10-CM | POA: Diagnosis not present

## 2014-12-03 DIAGNOSIS — R110 Nausea: Secondary | ICD-10-CM | POA: Diagnosis not present

## 2014-12-03 NOTE — ED Notes (Signed)
Pt states she has been having mid abdominal pain since this morning, had blood work done at PCP and was sent in for possible dehydration and further eval. Alert and oriented.

## 2014-12-04 ENCOUNTER — Emergency Department (HOSPITAL_COMMUNITY)
Admission: EM | Admit: 2014-12-04 | Discharge: 2014-12-04 | Disposition: A | Discharge status: Home / Self Care | Payer: 59 | Attending: Emergency Medicine | Admitting: Emergency Medicine

## 2014-12-04 ENCOUNTER — Encounter (HOSPITAL_COMMUNITY): Payer: Self-pay | Admitting: Emergency Medicine

## 2014-12-04 DIAGNOSIS — Z720 Tobacco use: Secondary | ICD-10-CM | POA: Insufficient documentation

## 2014-12-04 DIAGNOSIS — R110 Nausea: Secondary | ICD-10-CM | POA: Insufficient documentation

## 2014-12-04 DIAGNOSIS — Z79899 Other long term (current) drug therapy: Secondary | ICD-10-CM | POA: Insufficient documentation

## 2014-12-04 DIAGNOSIS — Z8619 Personal history of other infectious and parasitic diseases: Secondary | ICD-10-CM | POA: Insufficient documentation

## 2014-12-04 DIAGNOSIS — R1084 Generalized abdominal pain: Secondary | ICD-10-CM | POA: Insufficient documentation

## 2014-12-04 DIAGNOSIS — Z9049 Acquired absence of other specified parts of digestive tract: Secondary | ICD-10-CM | POA: Insufficient documentation

## 2014-12-04 DIAGNOSIS — Z87442 Personal history of urinary calculi: Secondary | ICD-10-CM | POA: Insufficient documentation

## 2014-12-04 DIAGNOSIS — Z3202 Encounter for pregnancy test, result negative: Secondary | ICD-10-CM | POA: Insufficient documentation

## 2014-12-04 DIAGNOSIS — Z8742 Personal history of other diseases of the female genital tract: Secondary | ICD-10-CM | POA: Insufficient documentation

## 2014-12-04 LAB — COMPREHENSIVE METABOLIC PANEL
ALT: 21 U/L (ref 14–54)
ANION GAP: 6 (ref 5–15)
AST: 19 U/L (ref 15–41)
Albumin: 4.1 g/dL (ref 3.5–5.0)
Alkaline Phosphatase: 53 U/L (ref 38–126)
BILIRUBIN TOTAL: 0.9 mg/dL (ref 0.3–1.2)
BUN: 8 mg/dL (ref 6–20)
CO2: 23 mmol/L (ref 22–32)
Calcium: 8.5 mg/dL — ABNORMAL LOW (ref 8.9–10.3)
Chloride: 105 mmol/L (ref 101–111)
Creatinine, Ser: 0.68 mg/dL (ref 0.44–1.00)
GFR calc non Af Amer: >60 mL/min (ref >60)
Glucose, Bld: 108 mg/dL — ABNORMAL HIGH (ref 65–99)
Potassium: 3.5 mmol/L (ref 3.5–5.1)
SODIUM: 134 mmol/L — AB (ref 135–145)
Total Protein: 7 g/dL (ref 6.5–8.1)

## 2014-12-04 LAB — URINALYSIS, ROUTINE W REFLEX MICROSCOPIC
BILIRUBIN URINE: NEGATIVE
GLUCOSE, UA: NEGATIVE mg/dL
Ketones, ur: NEGATIVE mg/dL
Leukocytes, UA: NEGATIVE
NITRITE: NEGATIVE
PROTEIN: NEGATIVE mg/dL
SPECIFIC GRAVITY, URINE: 1.009 (ref 1.005–1.030)
UROBILINOGEN UA: 0.2 mg/dL (ref 0.0–1.0)
pH: 6 (ref 5.0–8.0)

## 2014-12-04 LAB — CBC WITH DIFFERENTIAL/PLATELET
BASOS PCT: 0 % (ref 0–1)
Basophils Absolute: 0 10*3/uL (ref 0.0–0.1)
Eosinophils Absolute: 0 10*3/uL (ref 0.0–0.7)
Eosinophils Relative: 0 % (ref 0–5)
HCT: 43.5 % (ref 36.0–46.0)
HEMOGLOBIN: 15 g/dL (ref 12.0–15.0)
LYMPHS ABS: 0.8 10*3/uL (ref 0.7–4.0)
Lymphocytes Relative: 11 % — ABNORMAL LOW (ref 12–46)
MCH: 33 pg (ref 26.0–34.0)
MCHC: 34.5 g/dL (ref 30.0–36.0)
MCV: 95.8 fL (ref 78.0–100.0)
MONO ABS: 0.3 10*3/uL (ref 0.1–1.0)
Monocytes Relative: 4 % (ref 3–12)
Neutro Abs: 6.1 10*3/uL (ref 1.7–7.7)
Neutrophils Relative %: 85 % — ABNORMAL HIGH (ref 43–77)
Platelets: 236 10*3/uL (ref 150–400)
RBC: 4.54 MIL/uL (ref 3.87–5.11)
RDW: 12 % (ref 11.5–15.5)
WBC: 7.2 10*3/uL (ref 4.0–10.5)

## 2014-12-04 LAB — URINE MICROSCOPIC-ADD ON

## 2014-12-04 LAB — POC URINE PREG, ED: PREG TEST UR: NEGATIVE

## 2014-12-04 LAB — LIPASE, BLOOD: Lipase: 17 U/L — ABNORMAL LOW (ref 22–51)

## 2014-12-04 MED ORDER — SODIUM CHLORIDE 0.9 % IV BOLUS (SEPSIS)
1000.0000 mL | Freq: Once | INTRAVENOUS | Status: AC
Start: 2014-12-04 — End: 2014-12-04
  Administered 2014-12-04: 1000 mL via INTRAVENOUS

## 2014-12-04 MED ORDER — PROMETHAZINE HCL 25 MG PO TABS: 25.0000 mg | ORAL_TABLET | Freq: Four times a day (QID) | ORAL | Status: DC | PRN

## 2014-12-04 NOTE — ED Notes (Signed)
She is awake, alert and in no distress.  She is aware we are awaiting lab results and that we need u/s as soon as she may provide it.

## 2014-12-04 NOTE — Discharge Instructions (Signed)
Please read and follow all provided instructions.  Your diagnoses today include:  1. Generalized abdominal pain     Tests performed today include:  Blood counts and electrolytes  Blood tests to check liver and kidney function  Blood tests to check pancreas function  Urine test to look for infection and pregnancy (in women)  Vital signs. See below for your results today.   Medications prescribed:   Phenergan (promethazine) - for nausea and vomiting  Take any prescribed medications only as directed.  Home care instructions:   Follow any educational materials contained in this packet.  Follow-up instructions: Please follow-up with your primary care provider in the next 3 days for further evaluation of your symptoms.    Return instructions:  SEEK IMMEDIATE MEDICAL ATTENTION IF:  The pain does not go away or becomes severe   A temperature above 101F develops   Repeated vomiting occurs (multiple episodes)   The pain becomes localized to portions of the abdomen. The right side could possibly be appendicitis. In an adult, the left lower portion of the abdomen could be colitis or diverticulitis.   Blood is being passed in stools or vomit (bright red or black tarry stools)   You develop chest pain, difficulty breathing, dizziness or fainting, or become confused, poorly responsive, or inconsolable (young children)  If you have any other emergent concerns regarding your health  Additional Information: Abdominal (belly) pain can be caused by many things. Your caregiver performed an examination and possibly ordered blood/urine tests and imaging (CT scan, x-rays, ultrasound). Many cases can be observed and treated at home after initial evaluation in the emergency department. Even though you are being discharged home, abdominal pain can be unpredictable. Therefore, you need a repeated exam if your pain does not resolve, returns, or worsens. Most patients with abdominal pain don't  have to be admitted to the hospital or have surgery, but serious problems like appendicitis and gallbladder attacks can start out as nonspecific pain. Many abdominal conditions cannot be diagnosed in one visit, so follow-up evaluations are very important.  Your vital signs today were: BP 131/83 mmHg   Pulse 103   Temp(Src) 98.1 F (36.7 C) (Oral)   Resp 16   SpO2 97%   LMP 12/03/2014 If your blood pressure (bp) was elevated above 135/85 this visit, please have this repeated by your doctor within one month. --------------

## 2014-12-04 NOTE — ED Notes (Signed)
Pt states yesterday morning she started having abd pain so she went to see her PCP  At the office they did blood work and sent her here for further tests and evaluation  Pt states she has had low grade fever   Pt states at the office she was given a GI cocktail and had vomiting after receiving that but has not had any since

## 2014-12-04 NOTE — ED Provider Notes (Signed)
CSN: [id number]     Arrival date & time 12/04/14  9937 History   First MD Initiated Contact with Patient 12/04/14 [id number]     Chief Complaint  Patient presents with  . Abdominal Pain     (Consider location/radiation/quality/duration/timing/severity/associated sxs/prior Treatment) HPI Comments: Patient with history of appendectomy, uterine perforation due to IUD -- presents with complaint of epigastric and periumbilical abdominal pain which radiates outward across her entire abdomen for the past 2 days. Yesterday she saw her PCP who did labs. At that time, she had a white blood cell count of 12.3 and some signs of dehydration with an elevated red blood cell count and 15 ketones in the urine. She was sent to the emergency department for further evaluation and treatment but left because emergency department was busy. She left the ED and had a small meal. She has been nauseous and has vomited one time, which was right after receiving GI cocktail at the PCP office. Patient denies fever, URI symptoms, chest pain, shortness of breath, dysuria, hematuria. Patient is currently on her menstrual period. Patient has had similar pain in the past, most recently in January 2016. She has been worked up previously with ultrasound which did not demonstrate gallstones. She had a pelvic ultrasound at one point which did not demonstrate any abnormalities. Symptoms do not seem to be related with food. Patient does drink wine almost daily. She denies heavy NSAID use or recent travel. No recent antibody. Onset of symptoms acute. Course is waxing and waning. Nothing makes symptoms better.  Patient is a 44 y.o. female presenting with abdominal pain. The history is provided by the patient.  Abdominal Pain Associated symptoms: nausea   Associated symptoms: no chest pain, no cough, no diarrhea, no dysuria, no fever, no sore throat, no vaginal bleeding, no vaginal discharge and no vomiting     Past Medical History  Diagnosis Date   . H/O varicella   . History of kidney stones   . Headache(784.0)     migraines  . HSV-2 infection   . Abnormal Pap smear   . 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 Endometriosis    Past Surgical History  Procedure Laterality Date  . Appendectomy    . Wisdom tooth extraction    . Brain surgery    . Laparotomy     Family History  Problem Relation Age of Onset  . Depression Mother   . Heart disease Father   . Diabetes Maternal Grandfather    History  Substance Use Topics  . Smoking status: Light Tobacco Smoker  . Smokeless tobacco: Never Used  . Alcohol Use: 3.5 oz/week    7 Standard drinks or equivalent per week     Comment: socially    OB History    Gravida Para Term Preterm AB TAB SAB Ectopic Multiple Living   0 0 0 0 0 0 0 0 0 0      Review of Systems  Constitutional: Negative for fever.  HENT: Negative for rhinorrhea and sore throat.   Eyes: Negative for redness.  Respiratory: Negative for cough.   Cardiovascular: Negative for chest pain.  Gastrointestinal: Positive for nausea and abdominal pain. Negative for vomiting and diarrhea.  Genitourinary: Negative for dysuria, frequency, flank pain, vaginal bleeding, vaginal discharge and pelvic pain.  Musculoskeletal: Negative for myalgias.  Skin: Negative for rash.  Neurological: Negative for headaches.      Allergies  Codeine  Home Medications   Prior to Admission medications  Medication Sig Start Date End Date Taking? Authorizing Provider  atenolol (TENORMIN) 25 MG tablet Take 25 mg by mouth daily.    Historical Provider, MD  BIOTIN PO Take by mouth.    Historical Provider, MD  calcium carbonate 200 MG capsule Take 250 mg by mouth 2 (two) times daily with a meal.    Historical Provider, MD  Levonorgestrel-Ethinyl Estradiol (SEASONIQUE) 0.15-0.03 &0.01 MG tablet Take 1 tablet by mouth daily. 02/28/12   Crawford Givens, MD  medroxyPROGESTERone (DEPO-PROVERA) 150 MG/ML injection Inject 1 mL  (150 mg total) into the muscle once. 05/11/12   Delsa Bern, MD  MedroxyPROGESTERone Acetate (DEPO-PROVERA IM) Inject into the muscle.    Historical Provider, MD  Multiple Vitamin (MULTIVITAMIN) tablet Take 1 tablet by mouth daily.    Historical Provider, MD  [facility] [place] WORT PO Take by mouth.    Historical Provider, MD   BP 131/83 mmHg  Pulse 103  Temp(Src) 98.1 F (36.7 C) (Oral)  Resp 16  SpO2 97%  LMP 12/03/2014  Physical Exam  Constitutional: She appears well-developed and well-nourished.  HENT:  Head: Normocephalic and atraumatic.  Mouth/Throat: Oropharynx is clear and moist.  Eyes: Conjunctivae are normal. Right eye exhibits no discharge. Left eye exhibits no discharge.  Neck: Normal range of motion. Neck supple.  Cardiovascular: Normal rate and regular rhythm.   No murmur heard. Pulmonary/Chest: Effort normal and breath sounds normal. She has no decreased breath sounds. She has no wheezes. She has no rhonchi. She has no rales.  Abdominal: Soft. Bowel sounds are normal. She exhibits no distension. There is generalized tenderness (Mild). There is no rigidity, no rebound, no guarding, no CVA tenderness, no tenderness at McBurney's point and negative Murphy's sign.  Neurological: She is alert.  Skin: Skin is warm and dry.  Psychiatric: She has a normal mood and affect.  Nursing note and vitals reviewed.   ED Course  Procedures (including critical care time) Labs Review Labs Reviewed  CBC WITH DIFFERENTIAL/PLATELET - Abnormal; Notable for the following:    Neutrophils Relative % 85 (*)    Lymphocytes Relative 11 (*)    All other components within normal limits  COMPREHENSIVE METABOLIC PANEL - Abnormal; Notable for the following:    Sodium 134 (*)    Glucose, Bld 108 (*)    Calcium 8.5 (*)    All other components within normal limits  LIPASE, BLOOD - Abnormal; Notable for the following:    Lipase 17 (*)    All other components within normal limits  URINALYSIS, ROUTINE W  REFLEX MICROSCOPIC (NOT AT [facility]) - Abnormal; Notable for the following:    Hgb urine dipstick SMALL (*)    All other components within normal limits  URINE MICROSCOPIC-ADD ON  POC URINE PREG, ED    Imaging Review No results found.   EKG Interpretation None       7:00 AM Patient seen and examined. Work-up initiated. Fluids ordered. Patient declines pain and nausea medications at this time.   Vital signs reviewed and are as follows: BP 131/83 mmHg  Pulse 103  Temp(Src) 98.1 F (36.7 C) (Oral)  Resp 16  SpO2 97%  LMP 12/03/2014  9:21 AM results reviewed with patient. She states that she is feeling better. We discussed that lab work looks better today than yesterday. She is now tolerating PO's. She has gotten fluids.  Discussed d/c to home with treatment of symptoms vs CT scan. She feels comfortable with d/c especially since she is feeling better.  The patient was urged to return to the Emergency Department immediately with worsening of current symptoms, worsening abdominal pain, persistent vomiting, blood noted in stools, fever, or any other concerns. The patient verbalized understanding.    MDM   Final diagnoses:  Generalized abdominal pain   Patient with recurrent generalized abd pain. Yesterday she had vomiting, elevated WBC count, and signs of dehydration -- however WBC count is now normal, she is tolerating PO's and abd pain is improving. No definite indication for imaging. Abd is soft throughout ED stay. Patient is comfortable with d/c to home. We discussed s/s to return as above.   No dangerous or life-threatening conditions suspected or identified by history, physical exam, and by work-up. No indications for hospitalization identified.       Carlisle Cater, PA-C 12/04/14 2876  Blanchie Dessert, MD 12/06/14 2238

## 2014-12-11 ENCOUNTER — Other Ambulatory Visit: Payer: Self-pay | Admitting: Gastroenterology

## 2014-12-11 DIAGNOSIS — R1013 Epigastric pain: Secondary | ICD-10-CM

## 2014-12-16 ENCOUNTER — Ambulatory Visit
Admission: RE | Admit: 2014-12-16 | Discharge: 2014-12-16 | Disposition: A | Discharge status: Home / Self Care | Payer: 59 | Source: Ambulatory Visit | Attending: Gastroenterology | Admitting: Gastroenterology

## 2014-12-16 DIAGNOSIS — R1013 Epigastric pain: Secondary | ICD-10-CM

## 2016-02-04 ENCOUNTER — Emergency Department (HOSPITAL_BASED_OUTPATIENT_CLINIC_OR_DEPARTMENT_OTHER): Payer: BLUE CROSS/BLUE SHIELD

## 2016-02-04 ENCOUNTER — Encounter (HOSPITAL_BASED_OUTPATIENT_CLINIC_OR_DEPARTMENT_OTHER): Payer: Self-pay | Admitting: Emergency Medicine

## 2016-02-04 ENCOUNTER — Emergency Department (HOSPITAL_BASED_OUTPATIENT_CLINIC_OR_DEPARTMENT_OTHER)
Admission: EM | Admit: 2016-02-04 | Discharge: 2016-02-04 | Disposition: A | Discharge status: Home / Self Care | Payer: BLUE CROSS/BLUE SHIELD | Attending: Emergency Medicine | Admitting: Emergency Medicine

## 2016-02-04 DIAGNOSIS — R079 Chest pain, unspecified: Secondary | ICD-10-CM | POA: Diagnosis not present

## 2016-02-04 DIAGNOSIS — R0602 Shortness of breath: Secondary | ICD-10-CM | POA: Diagnosis not present

## 2016-02-04 DIAGNOSIS — Z79899 Other long term (current) drug therapy: Secondary | ICD-10-CM | POA: Diagnosis not present

## 2016-02-04 DIAGNOSIS — M549 Dorsalgia, unspecified: Secondary | ICD-10-CM | POA: Insufficient documentation

## 2016-02-04 DIAGNOSIS — F17200 Nicotine dependence, unspecified, uncomplicated: Secondary | ICD-10-CM | POA: Insufficient documentation

## 2016-02-04 HISTORY — DX: Benign neoplasm of brain, unspecified: D33.2

## 2016-02-04 LAB — BASIC METABOLIC PANEL
ANION GAP: 8 (ref 5–15)
BUN: 14 mg/dL (ref 6–20)
CHLORIDE: 107 mmol/L (ref 101–111)
CO2: 22 mmol/L (ref 22–32)
CREATININE: 0.77 mg/dL (ref 0.44–1.00)
Calcium: 9.4 mg/dL (ref 8.9–10.3)
GFR calc non Af Amer: >60 mL/min (ref >60)
Glucose, Bld: 107 mg/dL — ABNORMAL HIGH (ref 65–99)
Potassium: 3.7 mmol/L (ref 3.5–5.1)
SODIUM: 137 mmol/L (ref 135–145)

## 2016-02-04 LAB — TROPONIN I: Troponin I: <0.03 ng/mL (ref <0.03)

## 2016-02-04 LAB — CBC
HCT: 39.1 % (ref 36.0–46.0)
HEMOGLOBIN: 14.2 g/dL (ref 12.0–15.0)
MCH: 32.8 pg (ref 26.0–34.0)
MCHC: 36.3 g/dL — ABNORMAL HIGH (ref 30.0–36.0)
MCV: 90.3 fL (ref 78.0–100.0)
PLATELETS: 322 10*3/uL (ref 150–400)
RBC: 4.33 MIL/uL (ref 3.87–5.11)
RDW: 11.7 % (ref 11.5–15.5)
WBC: 7.2 10*3/uL (ref 4.0–10.5)

## 2016-02-04 LAB — D-DIMER, QUANTITATIVE: D-Dimer, Quant: <0.27 ug/mL-FEU (ref 0.00–0.50)

## 2016-02-04 MED ORDER — ASPIRIN 81 MG PO CHEW
324.0000 mg | CHEWABLE_TABLET | Freq: Once | ORAL | Status: AC
  Administered 2016-02-04: 324 mg via ORAL
  Filled 2016-02-04: qty 4

## 2016-02-04 NOTE — ED Triage Notes (Signed)
Intermittent L side chest pain radiating to L arm and occasionally into her jaw x 1 week.

## 2016-02-04 NOTE — ED Provider Notes (Signed)
[place] DEPT [facility] Provider Note   CSN: [id number] Arrival date & time: 02/04/16  1541     History   Chief Complaint Chief Complaint  Patient presents with  . Chest Pain    HPI Robin Bruce is a 45 y.o. female.  Patient with complaint of left upper anterior chest pain intermittently for one week. Only lasts seconds. But today it's been occurring more frequently but not lasting any longer. Associated with some shortness of breath no diaphoresis no nausea vomiting no leg swelling but did have right leg calf cramps 2 weeks ago. Patient's past medical history is not significant for any cardiac risk factors.      Past Medical History:  Diagnosis Date  . Abnormal Pap smear   . Brain tumor (benign) ([place])   . Endometriosis   . H/O varicella   . Headache(784.0)    migraines  . History of kidney stones   . HSV-2 infection   . 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 Endometriosis     Past Surgical History:  Procedure Laterality Date  . APPENDECTOMY    . BRAIN SURGERY    . LAPAROTOMY    . WISDOM TOOTH EXTRACTION      OB History    Gravida Para Term Preterm AB Living   0 0 0 0 0 0   SAB TAB Ectopic Multiple Live Births   0 0 0 0         Home Medications    Prior to Admission medications   Medication Sig Start Date End Date Taking? Authorizing Provider  ALPRAZolam Duanne Moron) 1 MG tablet Take 1 mg by mouth at bedtime as needed for anxiety.   Yes Historical Provider, MD  medroxyPROGESTERone (DEPO-PROVERA) 150 MG/ML injection Inject 150 mg into the muscle every 3 (three) months.   Yes Historical Provider, MD  etonogestrel (NEXPLANON) 68 MG IMPL implant 1 each by Subdermal route once.    Historical Provider, MD  HYDROcodone-acetaminophen (NORCO/VICODIN) 5-325 MG per tablet Take 1 tablet by mouth every 6 (six) hours as needed for  moderate pain.    Historical Provider, MD  Multiple Vitamin (MULTIVITAMIN) tablet Take 1 tablet by mouth daily.    Historical Provider, MD  promethazine (PHENERGAN) 25 MG tablet Take 1 tablet (25 mg total) by mouth every 6 (six) hours as needed for nausea or vomiting. 12/04/14   Carlisle Cater, PA-C    Family History Family History  Problem Relation Age of Onset  . Depression Mother   . Heart disease Father   . Diabetes Maternal Grandfather     Social History Social History  Substance Use Topics  . Smoking status: Light Tobacco Smoker  . Smokeless tobacco: Never Used  . Alcohol use 3.5 oz/week    7 Standard drinks or equivalent per week     Comment: socially      Allergies   Codeine   Review of Systems Review of Systems  Constitutional: Negative for diaphoresis and fever.  HENT: Negative for congestion.   Respiratory: Positive for shortness of breath.   Cardiovascular: Negative for chest pain, palpitations and leg swelling.  Gastrointestinal: Negative for abdominal pain, nausea and vomiting.  Genitourinary: Negative for dysuria.  Musculoskeletal: Positive for back pain.  Skin: Negative for rash.  Neurological: Negative for headaches.  Hematological: Does not bruise/bleed easily.  Psychiatric/Behavioral: Negative for confusion.  Physical Exam Updated Vital Signs BP 167/95 (BP Location: Right Arm)   Pulse 100   Temp 98.8 F (37.1 C) (Oral)   Resp 18   Ht 5\' 6"  (1.676 m)   Wt 74.4 kg   LMP 12/24/2015 Comment: depo  SpO2 98%   BMI 26.47 kg/m   Physical Exam  Constitutional: She is oriented to person, place, and time. She appears well-developed and well-nourished. No distress.  HENT:  Head: Normocephalic and atraumatic.  Eyes: EOM are normal. Pupils are equal, round, and reactive to light.  Neck: Normal range of motion. Neck supple.  Cardiovascular: Regular rhythm and normal heart sounds.   Slightly tachycardic  Pulmonary/Chest: Effort normal and breath  sounds normal.  Abdominal: Soft. Bowel sounds are normal.  Musculoskeletal: Normal range of motion. She exhibits no edema.  Neurological: She is alert and oriented to person, place, and time. She displays normal reflexes. No cranial nerve deficit. She exhibits normal muscle tone.  Skin: Skin is warm.  Nursing note and vitals reviewed.    ED Treatments / Results  Labs (all labs ordered are listed, but only abnormal results are displayed) Labs Reviewed  BASIC METABOLIC PANEL - Abnormal; Notable for the following:       Result Value   Glucose, Bld 107 (*)    All other components within normal limits  CBC - Abnormal; Notable for the following:    MCHC 36.3 (*)    All other components within normal limits  TROPONIN I  D-DIMER, QUANTITATIVE (NOT AT [facility])    EKG  EKG Interpretation  Date/Time:  Wednesday February 04 2016 15:51:02 EDT Ventricular Rate:  101 PR Interval:    QRS Duration: 94 QT Interval:  343 QTC Calculation: 445 R Axis:   -42 Text Interpretation:  Sinus tachycardia Left axis deviation Borderline low voltage, extremity leads Abnormal R-wave progression, early transition ST elevation, consider inferior injury Baseline wander in lead(s) I III aVL Confirmed by Rogene Houston  MD, Seira Cody [phone]) on 02/04/2016 3:54:42 PM       Radiology Dg Chest 2 View  Result Date: 02/04/2016 CLINICAL DATA:  Left side anterior chest pain for 2 weeks. EXAM: CHEST  2 VIEW COMPARISON:  None. FINDINGS: The lungs are clear wiithout focal pneumonia, edema, pneumothorax or pleural effusion. Nodular density/densities projecting over the lungs are compatible with pads for telemetry leads. The cardiopericardial silhouette is within normal limits for size. The visualized bony structures of the thorax are intact. IMPRESSION: No active cardiopulmonary disease. Electronically Signed   By: Misty Stanley M.D.   On: 02/04/2016 16:45    Procedures Procedures (including critical care time)  Medications  Ordered in ED Medications  aspirin chewable tablet 324 mg (324 mg Oral Given 02/04/16 1613)     Initial Impression / Assessment and Plan / ED Course  I have reviewed the triage vital signs and the nursing notes.  Pertinent labs & imaging results that were available during my care of the patient were reviewed by me and considered in my medical decision making (see chart for details).  Clinical Course   Patient with intermittent left-sided chest pain for one week. Never lasting more than seconds. Increase frequency today but no increased duration. Associated with some slight shortness of breath but no nausea or vomiting no diaphoresis. Patient did have some right leg calf cramps 2 weeks ago.  EKG did show some subtle ST segment changes inferiorly compared to previous EKG in 2010.  Workup here chest x-ray negative for pneumonia pneumothorax  or pulmonary edema. Troponin negative and d-dimer negative not likely representing DVT or pulmonary embolus. No evidence of any acute cardiac event.  Patient stable for discharge home. Recommend returning for any chest pain that lasts 15 minutes or longer. Also recommended considering a trial of Naprosyn for a few days to see if it causes improvement and if that does not work then considering a trial of Pepcid for several days.    Final Clinical Impressions(s) / ED Diagnoses   Final diagnoses:  Chest pain, unspecified chest pain type    New Prescriptions New Prescriptions   No medications on file     Fredia Sorrow, MD 02/04/16 1722

## 2016-02-04 NOTE — ED Notes (Signed)
Patient transported to X-ray 

## 2016-02-04 NOTE — Discharge Instructions (Signed)
Workup for the chest pain without any significant findings. Return for any chest pain lasting 15 or 20 minutes nonstop. Consider a trial of Naprosyn or Motrin at home to see if it makes a difference in the chest discomfort. Also consider a trial separately of Pepcid to see if that improves it. Would need to take the Pepcid for several days.

## 2016-02-09 ENCOUNTER — Encounter (HOSPITAL_COMMUNITY): Payer: Self-pay

## 2016-02-09 ENCOUNTER — Inpatient Hospital Stay (HOSPITAL_COMMUNITY)
Admission: AD | Admit: 2016-02-09 | Discharge: 2016-02-13 | DRG: 885 | Disposition: A | Discharge status: Home / Self Care | Payer: BLUE CROSS/BLUE SHIELD | Attending: Psychiatry | Admitting: Psychiatry

## 2016-02-09 DIAGNOSIS — I10 Essential (primary) hypertension: Secondary | ICD-10-CM | POA: Diagnosis present

## 2016-02-09 DIAGNOSIS — F332 Major depressive disorder, recurrent severe without psychotic features: Secondary | ICD-10-CM | POA: Diagnosis present

## 2016-02-09 DIAGNOSIS — Z818 Family history of other mental and behavioral disorders: Secondary | ICD-10-CM

## 2016-02-09 DIAGNOSIS — F329 Major depressive disorder, single episode, unspecified: Secondary | ICD-10-CM | POA: Diagnosis present

## 2016-02-09 DIAGNOSIS — Z87891 Personal history of nicotine dependence: Secondary | ICD-10-CM | POA: Diagnosis not present

## 2016-02-09 DIAGNOSIS — F331 Major depressive disorder, recurrent, moderate: Secondary | ICD-10-CM | POA: Diagnosis present

## 2016-02-09 LAB — RAPID URINE DRUG SCREEN, HOSP PERFORMED
Amphetamines: NOT DETECTED
Barbiturates: NOT DETECTED
Benzodiazepines: POSITIVE — AB
Cocaine: NOT DETECTED
Opiates: NOT DETECTED
Tetrahydrocannabinol: NOT DETECTED

## 2016-02-09 LAB — PREGNANCY, URINE: Preg Test, Ur: NEGATIVE

## 2016-02-09 MED ORDER — ALPRAZOLAM 1 MG PO TABS
1.0000 mg | ORAL_TABLET | Freq: Every evening | ORAL | Status: DC | PRN
  Filled 2016-02-09: qty 1

## 2016-02-09 MED ORDER — HYDROXYZINE HCL 25 MG PO TABS
25.0000 mg | ORAL_TABLET | Freq: Four times a day (QID) | ORAL | Status: DC | PRN
  Administered 2016-02-10 (×2): 25 mg via ORAL
  Filled 2016-02-09 (×2): qty 1

## 2016-02-09 MED ORDER — ADULT MULTIVITAMIN W/MINERALS CH
1.0000 | ORAL_TABLET | Freq: Every day | ORAL | Status: DC
  Administered 2016-02-09 – 2016-02-13 (×5): 1 via ORAL
  Filled 2016-02-09 (×8): qty 1

## 2016-02-09 MED ORDER — ALPRAZOLAM 0.5 MG PO TABS
0.5000 mg | ORAL_TABLET | Freq: Every evening | ORAL | Status: DC | PRN
Start: 2016-02-09 — End: 2016-02-10
  Administered 2016-02-09: 0.5 mg via ORAL

## 2016-02-09 MED ORDER — TRAZODONE HCL 50 MG PO TABS
50.0000 mg | ORAL_TABLET | Freq: Every evening | ORAL | Status: DC | PRN
  Filled 2016-02-09 (×2): qty 1

## 2016-02-09 MED ORDER — ALUM & MAG HYDROXIDE-SIMETH 200-200-20 MG/5ML PO SUSP: 30.0000 mL | ORAL | Status: DC | PRN

## 2016-02-09 MED ORDER — INFLUENZA VAC SPLIT QUAD 0.5 ML IM SUSY
0.5000 mL | PREFILLED_SYRINGE | INTRAMUSCULAR | Status: AC
  Administered 2016-02-10: 0.5 mL via INTRAMUSCULAR
  Filled 2016-02-09: qty 0.5

## 2016-02-09 MED ORDER — MAGNESIUM HYDROXIDE 400 MG/5ML PO SUSP: 30.0000 mL | Freq: Every day | ORAL | Status: DC | PRN

## 2016-02-09 MED ORDER — ACETAMINOPHEN 325 MG PO TABS
650.0000 mg | ORAL_TABLET | Freq: Four times a day (QID) | ORAL | Status: DC | PRN
  Administered 2016-02-09 – 2016-02-11 (×2): 650 mg via ORAL
  Filled 2016-02-09 (×2): qty 2

## 2016-02-09 MED ORDER — ALPRAZOLAM 0.5 MG PO TABS
ORAL_TABLET | ORAL | Status: AC
  Filled 2016-02-09: qty 1

## 2016-02-09 NOTE — Tx Team (Signed)
Initial Treatment Plan 02/09/2016 3:31 PM Robin Bruce [id number]    PATIENT STRESSORS: Educational concerns Occupational concerns Divorce from husband a year ago, separated for 3.5 years   PATIENT STRENGTHS: Capable of independent living Occupational psychologist fund of knowledge Motivation for treatment/growth   PATIENT IDENTIFIED PROBLEMS: Depression  Anxiety  Suicidal ideation  "I want to no cry all the time"  "I don't want to have anxiety everytime I leave the house"             DISCHARGE CRITERIA:  Improved stabilization in mood, thinking, and/or behavior Verbal commitment to aftercare and medication compliance  PRELIMINARY DISCHARGE PLAN: Outpatient therapy Medication management  PATIENT/FAMILY INVOLVEMENT: This treatment plan has been presented to and reviewed with the patient, Robin Bruce.  The patient and family have been given the opportunity to ask questions and make suggestions.  Windell Moment, RN 02/09/2016, 3:31 PM

## 2016-02-09 NOTE — H&P (Signed)
Behavioral Health Medical Screening Exam  Robin Bruce is an 45 y.o. female who presented to [facility] as a walk in due to worsening symptoms of depression and anxiety. She is meeting criteria for inpatient psychiatric treatment.   Total Time spent with patient: 20 minutes  Psychiatric Specialty Exam: Physical Exam  Constitutional: She is oriented to person, place, and time. She appears well-developed and well-nourished.  HENT:  Head: Normocephalic and atraumatic.  Right Ear: External ear normal.  Left Ear: External ear normal.  Mouth/Throat: Oropharynx is clear and moist.  Eyes: Conjunctivae are normal. Pupils are equal, round, and reactive to light.  Neck: Normal range of motion. Neck supple.  Cardiovascular: Normal rate, regular rhythm, normal heart sounds and intact distal pulses.   Respiratory: Effort normal and breath sounds normal.  GI: Soft. Bowel sounds are normal.  Musculoskeletal: Normal range of motion.  Neurological: She is alert and oriented to person, place, and time.  Skin: Skin is warm and dry.    Review of Systems  Constitutional: Negative for chills, fever, malaise/fatigue and weight loss.  HENT: Negative for hearing loss and tinnitus.   Eyes: Negative for blurred vision, double vision, photophobia and pain.  Respiratory: Negative for cough, hemoptysis, sputum production and shortness of breath.   Cardiovascular: Negative for chest pain, palpitations, orthopnea and claudication.  Gastrointestinal: Negative for abdominal pain, diarrhea, heartburn, nausea and vomiting.  Genitourinary: Negative for dysuria, frequency and urgency.  Musculoskeletal: Negative for back pain, myalgias and neck pain.  Skin: Negative for itching and rash.  Neurological: Negative for dizziness, tingling, tremors, sensory change, speech change, focal weakness and headaches.  Endo/Heme/Allergies: Negative for environmental allergies. Does not bruise/bleed easily.  Psychiatric/Behavioral:  Positive for depression and suicidal ideas. Negative for hallucinations, memory loss and substance abuse. The patient is nervous/anxious and has insomnia.     Blood pressure (!) 146/98, pulse 86, temperature 98.4 F (36.9 C), resp. rate 18, last menstrual period 12/24/2015, SpO2 100 %.There is no height or weight on file to calculate BMI.  General Appearance: Casual  Eye Contact:  Good  Speech:  Clear and Coherent  Volume:  Normal  Mood:  Anxious  Affect:  Depressed  Thought Process:  Coherent and Goal Directed  Orientation:  Full (Time, Place, and Person)  Thought Content:  Symptoms, worries, concerns   Suicidal Thoughts:  Yes.  with intent/plan  Homicidal Thoughts:  No  Memory:  Immediate;   Good Recent;   Good Remote;   Good  Judgement:  Fair  Insight:  Present  Psychomotor Activity:  Normal  Concentration: Concentration: Good and Attention Span: Good  Recall:  Good  Fund of Knowledge:Good  Language: Good  Akathisia:  No  Handed:  Right  AIMS (if indicated):     Assets:  Communication Skills Desire for Improvement Housing Intimacy Leisure Time Physical Health Resilience Social Support  Sleep:       Musculoskeletal: Strength & Muscle Tone: within normal limits Gait & Station: normal Patient leans: N/A  Blood pressure (!) 146/98, pulse 86, temperature 98.4 F (36.9 C), resp. rate 18, last menstrual period 12/24/2015, SpO2 100 %.  Recommendations:  Based on my evaluation the patient does not appear to have an emergency medical condition.  [name], NP 02/09/2016, 1:48 PM

## 2016-02-09 NOTE — Progress Notes (Signed)
Robin Bruce is a 45 year old female being admitted voluntarily to [facility] as a walk in.  She came to [facility] with her neighbor who encouraged her to get help.  She reports her current stressors are school, new work responsibilities, divorce from her ex-husband 1 year ago and relationship struggles with her new boyfriend.  She denies SI/HI or A/V hallucinations at this time. She did state that she has feeling that she wants to go to sleep and not wake up.  She has no psychiatric IP treatment and currently does not see a therapist.  She reports crying spells, no energy, isolation, anxiety, no enjoyment with anything and hopelessness at times.  She reports history of hypertension but hasn't been treated in "years."   She is reporting headache 4/10 from "crying too much."  She appears to be in no physical distress.  Oriented her to the unit.  Admission paperwork completed and signed.  Belongings searched and secured in locker # 28.  Skin assessment completed and noted old IUD removal scar on right lower abdomen.  Q 15 minute checks initiated for safety.  We will monitor the progress towards her goals.

## 2016-02-09 NOTE — BH Assessment (Signed)
Assessment Note   Robin Bruce is an 45 y.o. female who came to [facility] as a walk in with her neighbor. Pt was tearful with rapid speech and shallow breathing. She states that she has been having the following symptoms for the past two weeks: tearfulness, depression, fatigue, anxiety, panic attacks, psychosomatic symptoms such as shooting pains with no physical cause and thoughts of suicide. She states that she has been having thoughts that she "just doesn't want to be here anymore and she just wants to sleep". Neighbor said that she has said something about a plan of cutting her wrists in the past couple of days which pt admitted to as well. She states that she has a family history of suicide attempts and her mom has been hospitalized several times. She states that her friends, family and coworkers have noticed her change in behavior and have been conerned about her. Pt has missed several days of work in the past two weeks which is not typical of her. She states that she "always has it together and never lets anyone see she's struggling". She states that she got a divorce 3 years ago and feels like she never dealt with it. She has been hearing songs that remind her of her ex husband and she feels like it could have triggered some of her anxiety and depression. She states that she has had anxiety and panic attacks her whole life but doesn't typically have depression issues. She states that the last time she saw someone for depression was when she had a loss (a loved one passed away) 7 years ago and she was put on antidepressants she states that she had a bad reaction to them and didn't take them long. She states that she currently sees her PCP at [facility] at [facility] for .25 xanex as needed but is not on any other psychiatric medications. She does not have an outpatient therapist but went to see someone with her ex husband 3 years ago before they divorced. She states that she does not have a  substance abuse or addiction history but drinks a bottle of wine in a week. She denies homicidal ideations or A/V hallucinations or any history of this.   Per [name], NP pt meets inpatient criteria and can be placed on the unit at [facility]. Per [name] there is a bed available on the 400 hall unit and pt can be direct admit. [name] NP performed medical screening exam and pt did not need to go for medical clearance. Pt agrees with this disposition and will be signing in voluntarily.   Diagnosis: Major Depressive Disorder Single Episode, Severe, Generalized Anxiety Disorder  Past Medical History:  Past Medical History:  Diagnosis Date  . Abnormal Pap smear   . Brain tumor (benign) ([place])   . Endometriosis   . H/O varicella   . Headache(784.0)    migraines  . History of kidney stones   . HSV-2 infection   . 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 APPENDECTOMY    . BRAIN SURGERY    . LAPAROTOMY    . WISDOM TOOTH EXTRACTION      Family History:  Family History  Problem Relation Age of Onset  . Depression Mother   . Heart disease Father   . Diabetes Maternal Grandfather     Social History:  reports that she has been smoking.  She has never  used smokeless tobacco. She reports that she drinks about 3.5 oz of alcohol per week . She reports that she does not use drugs.  Additional Social History:  Alcohol / Drug Use History of alcohol / drug use?: No history of alcohol / drug abuse  CIWA:   COWS:    PATIENT STRENGTHS: (choose at least two) Average or above average intelligence Supportive family/friends  Allergies:  Allergies  Allergen Reactions  . Codeine Other (See Comments)    Cramps    Home Medications:  (Not in a hospital admission)  OB/GYN Status:  Patient's last menstrual period was 12/24/2015.  General Assessment Data Location of Assessment: [facility]  Assessment: In system Is this a Tele or Face-to-Face Assessment?: Face-to-Face Is this an Initial Assessment or a Re-assessment for this encounter?: Initial Assessment Marital status: Divorced Is patient pregnant?: No Pregnancy Status: No Living Arrangements: Spouse/significant other Can pt return to current living arrangement?: Yes Admission Status: Voluntary Is patient capable of signing voluntary admission?: Yes Referral Source: Self/Family/Friend Insurance type: Insurance risk surveyor Exam ([place]) Medical Exam completed: Yes (Completed by [name] NP )  Crisis Care Plan Living Arrangements: Spouse/significant other Legal Guardian: Other: (Self) Name of Psychiatrist: None Name of Therapist: None  Education Status Is patient currently in school?: Yes Highest grade of school patient has completed: Completing college courses now  Risk to self with the past 6 months Suicidal Ideation: Yes-Currently Present Has patient been a risk to self within the past 6 months prior to admission? : Yes Suicidal Intent: No Has patient had any suicidal intent within the past 6 months prior to admission? : No Is patient at risk for suicide?: Yes Suicidal Plan?: No Has patient had any suicidal plan within the past 6 months prior to admission? : Yes Access to Means: No What has been your use of drugs/alcohol within the last 12 months?: Drinking 1 bottle of wine weekly- no history of substance abuse or addiction Previous Attempts/Gestures: No How many times?: 0 Triggers for Past Attempts: None known Intentional Self Injurious Behavior: None Family Suicide History: Yes (Mom has attempted suicide several times. ) Recent stressful life event(s): Divorce Persecutory voices/beliefs?: No Depression: Yes Depression Symptoms: Despondent, Loss of interest in usual pleasures, Feeling worthless/self pity, Tearfulness, Insomnia, Isolating, Fatigue Substance abuse history and/or treatment  for substance abuse?: No Suicide prevention information given to non-admitted patients: Not applicable  Risk to Others within the past 6 months Homicidal Ideation: No Does patient have any lifetime risk of violence toward others beyond the six months prior to admission? : No Thoughts of Harm to Others: No Current Homicidal Intent: No Current Homicidal Plan: No Access to Homicidal Means: No Identified Victim: none History of harm to others?: No Assessment of Violence: None Noted Violent Behavior Description: none Does patient have access to weapons?: No Criminal Charges Pending?: No Does patient have a court date: No Is patient on probation?: No  Psychosis Hallucinations: None noted Delusions: None noted  Mental Status Report Appearance/Hygiene: Disheveled Eye Contact: Good Motor Activity: Freedom of movement Speech: Logical/coherent, Rapid Level of Consciousness: Alert Mood: Anxious, Sad Affect: Depressed, Anxious Anxiety Level: Panic Attacks Panic attack frequency: has had panic attacks her whole life- most recent one this week Most recent panic attack: this week Thought Processes: Coherent Judgement: Impaired Orientation: Person, Place, Time, Situation Obsessive Compulsive Thoughts/Behaviors: Moderate  Cognitive Functioning Concentration: Decreased Memory: Recent Intact, Remote Intact IQ: Average Insight: Fair Impulse Control: Fair Appetite: Fair Weight  Loss: 0 Weight Gain: 0 Sleep: Decreased Total Hours of Sleep: 5 Vegetative Symptoms: Staying in bed  ADLScreening [facility] Assessment Services) Patient's cognitive ability adequate to safely complete daily activities?: Yes Patient able to express need for assistance with ADLs?: Yes Independently performs ADLs?: Yes (appropriate for developmental age)  Prior Inpatient Therapy Prior Inpatient Therapy: No  Prior Outpatient Therapy Prior Outpatient Therapy: Yes Prior Therapy Dates: 3 years ago Prior Therapy  Facilty/Provider(s): pt can't remember Reason for Treatment: Marriage Counseling Does patient have an ACCT team?: No Does patient have Intensive In-House Services?  : No Does patient have [facility] services? : No Does patient have P4CC services?: No  ADL Screening (condition at time of admission) Patient's cognitive ability adequate to safely complete daily activities?: Yes Is the patient deaf or have difficulty hearing?: No Does the patient have difficulty seeing, even when wearing glasses/contacts?: No Does the patient have difficulty concentrating, remembering, or making decisions?: No Patient able to express need for assistance with ADLs?: Yes Does the patient have difficulty dressing or bathing?: No Independently performs ADLs?: Yes (appropriate for developmental age) Does the patient have difficulty walking or climbing stairs?: No Weakness of Legs: None Weakness of Arms/Hands: None  Home Assistive Devices/Equipment Home Assistive Devices/Equipment: None  Therapy Consults (therapy consults require a physician order) PT Evaluation Needed: No OT Evalulation Needed: No SLP Evaluation Needed: No Abuse/Neglect Assessment (Assessment to be complete while patient is alone) Physical Abuse: Denies Verbal Abuse: Denies Sexual Abuse: Denies Exploitation of patient/patient's resources: Denies Self-Neglect: Denies Values / Beliefs Cultural Requests During Hospitalization: None Spiritual Requests During Hospitalization: None Consults Spiritual Care Consult Needed: No Social Work Consult Needed: No Regulatory affairs officer (For Healthcare) Does patient have an advance directive?: No Would patient like information on creating an advanced directive?: No - patient declined information Nutrition Screen- [facility] Adult/WL/AP Patient's home diet: Regular Has the patient recently lost weight without trying?: No Has the patient been eating poorly because of a decreased appetite?: No Malnutrition  Screening Tool Score: 0  Additional Information 1:1 In Past 12 Months?: No CIRT Risk: No Elopement Risk: No Does patient have medical clearance?: Yes     Disposition:  Disposition Initial Assessment Completed for this Encounter: Yes Disposition of Patient: Inpatient treatment program Type of inpatient treatment program: Adult  Chevon Fomby 02/09/2016 1:38 PM

## 2016-02-10 DIAGNOSIS — F332 Major depressive disorder, recurrent severe without psychotic features: Principal | ICD-10-CM

## 2016-02-10 MED ORDER — LORAZEPAM 0.5 MG PO TABS
0.5000 mg | ORAL_TABLET | Freq: Four times a day (QID) | ORAL | Status: DC | PRN
  Administered 2016-02-11 – 2016-02-12 (×2): 0.5 mg via ORAL
  Filled 2016-02-10 (×2): qty 1

## 2016-02-10 MED ORDER — CITALOPRAM HYDROBROMIDE 20 MG PO TABS
20.0000 mg | ORAL_TABLET | Freq: Every day | ORAL | Status: DC
  Administered 2016-02-10 – 2016-02-13 (×4): 20 mg via ORAL
  Filled 2016-02-10 (×7): qty 1

## 2016-02-10 NOTE — H&P (Signed)
Psychiatric Admission Assessment Adult  Patient Identification: Robin Bruce  MRN:  [id number]  Date of Evaluation:  02/10/2016  Chief Complaint: Worsening symptoms of depression. Principal Diagnosis: Major depressive disorder, recurrent episodes, severe.  Diagnosis:   Patient Active Problem List   Diagnosis Date Noted  . MDD (major depressive disorder), recurrent episode ([place]) [F33.9] 02/09/2016  . 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 Endometriosis [N80.9]    History of Present Illness: This is an admission assessment for Robin Bruce, a 45 year old Caucasian female with hx of Major depression, recurrent episodes. Admitted to [facility] adult unit as a walk-in, medically cleared at the [facility]. During this [place] reports, "I have been crying uncontrollably, self-isolating, missing work for many weeks now. My symptoms worsened in the last 2 weeks. I was separated for 3 years from my marriage of 73 years, divorced now for 18 months. I probably may not have dealt with the loss of my marriage properly & it may be that it is just hitting me. I have taken an antidepressant in the past, had bad reaction (excessive weakness, dizziness, tingling sensation), stopped taking it. That was 8 years ago. My psychiatrist did not do anything about the reactions when I told him. I have bad panic attacks". Nalda presented familial hx of Major depression: mother, Delusional/paraoid disorder: Father & brother.  Associated Signs/Symptoms:  Depression Symptoms:  depressed mood, insomnia, feelings of worthlessness/guilt, hopelessness, anxiety, panic attacks,  (Hypo) Manic Symptoms:  Denies any hypomanic symptoms  Anxiety Symptoms:  Excessive Worry, Panic attacks  Psychotic Symptoms:  Denies any hallucinations, delusional thoughts or paranoia.  PTSD Symptoms: Denies any hx of PTSD/symptoms.  Total Time spent with patient: 1 hour  Past Psychiatric History:  Major depressive disorder, recurrent.  Is the patient at risk to self? No.  Has the patient been a risk to self in the past 6 months? No.  Has the patient been a risk to self within the distant past? No.  Is the patient a risk to others? No.  Has the patient been a risk to others in the past 6 months? No.  Has the patient been a risk to others within the distant past? No.   Prior Inpatient Therapy: Prior Inpatient Therapy: No Prior Outpatient Therapy: Prior Outpatient Therapy: Yes Prior Therapy Dates: 3 years ago Prior Therapy Facilty/Provider(s): pt can't remember Reason for Treatment: Marriage Counseling Does patient have an ACCT team?: No Does patient have Intensive In-House Services?  : No Does patient have [facility] services? : No Does patient have P4CC services?: No  Alcohol Screening: 1. How often do you have a drink containing alcohol?: 2 to 4 times a month 2. How many drinks containing alcohol do you have on a typical day when you are drinking?: 1 or 2 3. How often do you have six or more drinks on one occasion?: Never Preliminary Score: 0 9. Have you or someone else been injured as a result of your drinking?: No 10. Has a relative or friend or a doctor or another health worker been concerned about your drinking or suggested you cut down?: No Alcohol Use Disorder Identification Test Final Score (AUDIT): 2 Brief Intervention: AUDIT score less than 7 or less-screening does not suggest unhealthy drinking-brief intervention not indicated  Substance Abuse History in the last 12 months:  No.  Consequences of Substance Abuse: Denies any substance abuse/dependence HX  Previous Psychotropic Medications: Yes, unable to remember the name.  Psychological Evaluations: Yes  Past Medical History:  Past Medical History:  Diagnosis Date  . Abnormal Pap smear   . Brain tumor (benign) ([place])   . Endometriosis   . H/O varicella   . Headache(784.0)    migraines  . History of kidney  stones   . HSV-2 infection   . 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 APPENDECTOMY    . BRAIN SURGERY    . LAPAROTOMY    . WISDOM TOOTH EXTRACTION     Family History:  Family History  Problem Relation Age of Onset  . Depression Mother   . Heart disease Father   . Diabetes Maternal Grandfather    Family Psychiatric  History: Paranoid/delusional disorder: Father & brother,  Major depressive disorder: Mother  Tobacco Screening: Have you used any form of tobacco in the last 30 days? (Cigarettes, Smokeless Tobacco, Cigars, and/or Pipes): No  Social History:  History  Alcohol Use  . 6.6 oz/week  . 7 Standard drinks or equivalent, 4 Glasses of wine per week    Comment: socially      History  Drug Use    Additional Social History: Marital status: Divorced Divorced, when?: 3 years ago What types of issues is patient dealing with in the relationship?: Reports they were not growing together, did not share same goals.  No sex, communication was poor.  Husband left patient Additional relationship information: Patient currently in new relationship of 2 years.  Name is Raquel Sarna and he has a child, thus she is a step mother.  Are you sexually active?: Yes What is your sexual orientation?: heterosexual Has your sexual activity been affected by drugs, alcohol, medication, or emotional stress?: none.  Reports she is less sexually active with boyfriend due to depression and emotional stress Does patient have children?: No (step mother to a 55 year old)    Pain Medications: no Prescriptions: see MAR Over the Counter: no History of alcohol / drug use?: No history of alcohol / drug abuse  Allergies:   Allergies  Allergen Reactions  . Codeine Other (See Comments)    Cramps   Lab Results:  Results for orders placed or performed during the hospital encounter of 02/09/16 (from the past 48 hour(s))  Pregnancy, urine      Status: None   Collection Time: 02/09/16  2:08 PM  Result Value Ref Range   Preg Test, Ur NEGATIVE NEGATIVE    Comment:        THE SENSITIVITY OF THIS METHODOLOGY IS >20 mIU/mL. Performed at [facility]   Urine rapid drug screen (hosp performed)     Status: Abnormal   Collection Time: 02/09/16  2:08 PM  Result Value Ref Range   Opiates NONE DETECTED NONE DETECTED   Cocaine NONE DETECTED NONE DETECTED   Benzodiazepines POSITIVE (A) NONE DETECTED   Amphetamines NONE DETECTED NONE DETECTED   Tetrahydrocannabinol NONE DETECTED NONE DETECTED   Barbiturates NONE DETECTED NONE DETECTED    Comment:        DRUG SCREEN FOR MEDICAL PURPOSES ONLY.  IF CONFIRMATION IS NEEDED FOR ANY PURPOSE, NOTIFY LAB WITHIN 5 DAYS.        LOWEST DETECTABLE LIMITS FOR URINE DRUG SCREEN Drug Class       Cutoff (ng/mL) Amphetamine      1000 Barbiturate      200 Benzodiazepine   935 Tricyclics       701 Opiates  300 Cocaine          300 THC              50 Performed at [facility]    Blood Alcohol level:  No results found for: [facility]  Metabolic Disorder Labs:  No results found for: HGBA1C, MPG No results found for: PROLACTIN No results found for: CHOL, TRIG, HDL, CHOLHDL, VLDL, LDLCALC  Current Medications: Current Facility-Administered Medications  Medication Dose Route Frequency Provider Last Rate Last Dose  . acetaminophen (TYLENOL) tablet 650 mg  650 mg Oral Q6H PRN Niel Hummer, NP   650 mg at 02/09/16 1529  . ALPRAZolam Duanne Moron) tablet 0.5 mg  0.5 mg Oral QHS PRN Laverle Hobby, PA-C   0.5 mg at 02/09/16 2158  . alum & mag hydroxide-simeth (MAALOX/MYLANTA) 200-200-20 MG/5ML suspension 30 mL  30 mL Oral Q4H PRN Niel Hummer, NP      . hydrOXYzine (ATARAX/VISTARIL) tablet 25 mg  25 mg Oral Q6H PRN Niel Hummer, NP      . Influenza vac split quadrivalent PF (FLUARIX) injection 0.5 mL  0.5 mL Intramuscular Tomorrow-1000 Fernando A Cobos, MD       . magnesium hydroxide (MILK OF MAGNESIA) suspension 30 mL  30 mL Oral Daily PRN Niel Hummer, NP      . multivitamin with minerals tablet 1 tablet  1 tablet Oral Daily Niel Hummer, NP   1 tablet at 02/10/16 0820  . traZODone (DESYREL) tablet 50 mg  50 mg Oral QHS PRN Niel Hummer, NP       PTA Medications: Prescriptions Prior to Admission  Medication Sig Dispense Refill Last Dose  . ALPRAZolam (XANAX) 0.25 MG tablet Take 0.25 mg by mouth 3 (three) times daily as needed for anxiety.   02/09/2016 at Unknown time  . BIOTIN PO Take 1 capsule by mouth daily.   unknown  . Cholecalciferol (VITAMIN D PO) Take 1 capsule by mouth daily.   unknown  . medroxyPROGESTERone (DEPO-PROVERA) 150 MG/ML injection Inject 150 mg into the muscle every 3 (three) months.   02/04/2016  . Pseudoephedrine-Ibuprofen 30-200 MG TABS Take 1 tablet by mouth every 6 (six) hours as needed (nasal congestion).   unknown  . promethazine (PHENERGAN) 25 MG tablet Take 1 tablet (25 mg total) by mouth every 6 (six) hours as needed for nausea or vomiting. (Patient not taking: Reported on 02/10/2016) 10 tablet 0 Not Taking at Unknown time   Musculoskeletal: Strength & Muscle Tone: within normal limits Gait & Station: normal Patient leans: Right  Psychiatric Specialty Exam: Physical Exam  Constitutional: She appears well-developed.  HENT:  Head: Normocephalic.  Eyes: Pupils are equal, round, and reactive to light.  Neck: Normal range of motion.  Cardiovascular: Normal rate.   Respiratory: Effort normal.  GI: Soft.  Genitourinary:  Genitourinary Comments: Denies any issues in this area  Musculoskeletal: Normal range of motion.  Neurological: She is alert.  Skin: Skin is warm.    Review of Systems  Constitutional: Negative.   HENT: Negative.   Eyes: Negative.   Respiratory: Negative.   Cardiovascular: Negative.   Gastrointestinal: Negative.   Genitourinary: Negative.   Musculoskeletal: Negative.   Skin: Negative.    Neurological: Negative.   Endo/Heme/Allergies: Negative.   Psychiatric/Behavioral: Positive for depression. Negative for hallucinations, memory loss and suicidal ideas. The patient is nervous/anxious and has insomnia.     Blood pressure (!) 135/93, pulse (!) 101, temperature 98.3 F (36.8  C), temperature source Oral, resp. rate 16, height 5' 5.5" (1.664 m), weight 73.9 kg (163 lb), last menstrual period 12/24/2015, SpO2 100 %.Body mass index is 26.71 kg/m.  General Appearance: Casual, presents tearful  Eye Contact:  Fair  Speech:  Clear, coherent, normal rate, however, not spontaneous.  Volume:  Normal  Mood:  Anxious, Depressed and Dysphoric  Affect:  Tearful  Thought Process:  Coherent and Goal Directed  Orientation:  Full (Time, Place, and Person)  Thought Content:  Rumination, denies any hallucinations, delusional thoughts or paranoia.  Suicidal Thoughts:  Denies any thoughts, plans or intent.  Homicidal Thoughts:  Denies any thoughts, plans or intent.  Memory:  Immediate;   Good Recent;   Good Remote;   Good  Judgement:  Fair  Insight:  Fair  Psychomotor Activity:  Decreased  Concentration:  Concentration: Fair and Attention Span: Fair  Recall:  [employer] of Knowledge:  Fair  Language:  Good  Akathisia:  Negative  Handed:  Right  AIMS (if indicated):     Assets:  Communication Skills Desire for Improvement  ADL's:  Intact  Cognition:  WNL  Sleep:  Number of Hours: 6   Treatment Plan Summary: Daily contact with patient to assess and evaluate symptoms and progress in treatment and Medication management: 1. Admit for crisis management and stabilization, estimated length of stay 3-5 days.  2. Medication management to reduce current symptoms to base line and improve the patient's overall level of functioning: See MAR 3. Treat health problems as indicated: Initiate Atenolol 25 mg for HTN..  4. Develop treatment plan to decrease risk of relapse upon discharge and the need  for readmission.  5. Psycho-social education regarding relapse prevention and self care.  6. Health care follow up as needed for medical problems.  7. Review, reconcile, and reinstate any pertinent home medications for other health issues where appropriate. 8. Call for consults with hospitalist for any additional specialty patient care services as needed.  Observation Level/Precautions:  15 minute checks  Laboratory:  Per ED, UDS positive for Benzodiazepine.  Psychotherapy: Group sessions   Medications: See MAR   Consultations: As needed   Discharge Concerns: Safety   Estimated LOS: 3-5 days  Other: Admit to [address]     Physician Treatment Plan for Primary Diagnosis: Major depressive disorder  Long Term Goal(s): Improvement in symptoms so as ready for discharge  Short Term Goals: Ability to identify changes in lifestyle to reduce recurrence of condition will improve, Ability to disclose and discuss suicidal ideas, Ability to identify and develop effective coping behaviors will improve and Compliance with prescribed medications will improve  Physician Treatment Plan for Secondary Diagnosis: Active Problems:   MDD (major depressive disorder), recurrent episode ([place])  Long Term Goal(s): Improvement in symptoms so as ready for discharge  Short Term Goals: Ability to identify changes in lifestyle to reduce recurrence of condition will improve, Ability to verbalize feelings will improve, Ability to identify and develop effective coping behaviors will improve and Compliance with prescribed medications will improve  I certify that inpatient services furnished can reasonably be expected to improve the patient's condition.    Encarnacion Slates, NP, PMHNP, FNP-BC 9/19/201710:36 AM   I have reviewed case with NP and have met with patient Agree with NP note and assessment  45 year old divorced female, no children, employed , lives with boyfriend . States she has been feeling progressively more  depressed . She attributes her depression at least partially to her  divorce a year and a half ago. States she " is still not over it", and states " like when I hear my wedding song it makes me feel really bad ". Also describes increased stress at work. States she has also been experiencing significant anxiety, frequent panic attacks. Describes passive suicidal ideations " like I would like to go to sleep and not wake up, but not thoughts of hurting myself " Denies any psychotic symptoms .  Psychiatric History -No prior psychiatric admissions, denies history of suicide attempts , no history of self cutting, no history of psychosis, denies history of mania, reports panic attacks, denies agoraphobia . States she has had prior episodes of depression but " never this bad".  Denies alcohol or drug abuse  Medical History - states she has been told she had HTN in the past . Family- mother has history of depression and has attempted suicide , father is alcoholic  Dx- Major Depression  Plan - inpatient admission .  Agrees to Celexa 20 mgrs QDAY. Will check TSH

## 2016-02-10 NOTE — Progress Notes (Signed)
D: Pt A & O X3. Presents with depressed affect and mood on initial contact and was guarded as well at the time. 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 Pt denies SI, HI, AVH and pain. Endorsed anxiety 5/10 and depression 7/10. Forwards little with a brighter mood (smiles, less crying) as this shift progressed and became more interactive with peers and staff.  A: Emotional support and availability provided. Encouraged to voice needs / concerns. Scheduled and PRN (Vistaril--C/O anxiety) medications administered as prescribed with verbal education. Dr. Parke Poisson made aware of pt's h/o of HTN and elevated BP; states to monitor pt till tomorrow and will reassess then. Q 15 minutes checks maintained on and off unit without issues.  R: Pt receptive to care. Compliant with medications when offered, reported feeling " a little dizzy from the Vistaril, but I'm ok now". Attended scheduled groups on unit. Tolerates all PO intake well. Remains safe on and off unit. POC maintained.

## 2016-02-10 NOTE — BHH Suicide Risk Assessment (Signed)
[facility] Ad[facility] Suicide Risk Assessment   Nursing information obtained from:  Patient Demographic factors:  Divorced or widowed, Caucasian Current Mental Status:  NA Loss Factors:  Loss of significant relationship Historical Factors:  Family history of mental illness or substance abuse Risk Reduction Factors:  Employed, Living with another person, especially a relative  Total Time spent with patient: 45 minutes Principal Problem:  Major Depression, no Psychotic Features  Diagnosis:   Patient Active Problem List   Diagnosis Date Noted  . MDD (major depressive disorder), recurrent episode ([place]) [F33.9] 02/09/2016  . 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 Endometriosis [N80.9]      Continued Clinical Symptoms:  Alcohol Use Disorder Identification Test Final Score (AUDIT): 2 The "Alcohol Use Disorders Identification Test", Guidelines for Use in Primary Care, Second Edition.  World Pharmacologist [facility]). Score between 0-7:  no or low risk or alcohol related problems. Score between 8-15:  moderate risk of alcohol related problems. Score between 16-19:  high risk of alcohol related problems. Score 20 or above:  warrants further diagnostic evaluation for alcohol dependence and treatment.   CLINICAL FACTORS:  45 year old divorced female, no children, employed , lives with boyfriend . States she has been feeling progressively more depressed . She attributes her depression at least partially to her divorce a year and a half ago. States she " is still not over it", and states " like when I hear my wedding song it makes me feel really bad ". Also describes increased stress at work. States she has also been experiencing significant anxiety, frequent panic attacks. Describes passive suicidal ideations " like I would like to go to sleep and not wake up, but not thoughts of hurting myself " Denies any psychotic symptoms .  Psychiatric History -No prior  psychiatric admissions, denies history of suicide attempts , no history of self cutting, no history of psychosis, denies history of mania, reports panic attacks, denies agoraphobia . States she has had prior episodes of depression but " never this bad".  Denies alcohol or drug abuse  Medical History - states she has been told she had HTN in the past . Family- mother has history of depression and has attempted suicide , father is alcoholic  Dx- Major Depression  Plan - inpatient admission .  Agrees to Celexa 20 mgrs QDAY. Will check TSH     Musculoskeletal: Strength & Muscle Tone: Normal Gait & Station: normal Patient leans: N/A  Psychiatric Specialty Exam: Physical Exam  ROS (+) headache, no chest pain, no shortness of breath, no nausea, no vomiting   Blood pressure (!) 135/93, pulse (!) 101, temperature 98.3 F (36.8 C), temperature source Oral, resp. rate 16, height 5' 5.5" (1.664 m), weight 163 lb (73.9 kg), last menstrual period 12/24/2015, SpO2 100 %.Body mass index is 26.71 kg/m.  General Appearance: Well Groomed  Eye Contact:  Good  Speech:  Normal Rate  Volume:  Normal  Mood:  Depressed  Affect:  constricted, tearful at times   Thought Process:  Linear  Orientation:  Full (Time, Place, and Person)  Thought Content:  no hallucinations, no delusions , not internally preoccupied   Suicidal Thoughts:  No- denies any suicidal or self injurious ideations, contracts for safety on unit , denies any homicidal ideations or violent ideations  Homicidal Thoughts:  No  Memory:  recent and remote grossly intact   Judgement:  Good  Insight:  Good  Psychomotor Activity:  Normal  Concentration:  Concentration: Good and Attention Span: Good  Recall:  Good  Fund of Knowledge:  Good  Language:  Good  Akathisia:  Negative  Handed:  Right  AIMS (if indicated):     Assets:  Desire for Improvement Resilience  ADL's:  Intact  Cognition:  WNL  Sleep:  Number of Hours: 6       COGNITIVE FEATURES THAT CONTRIBUTE TO RISK:  Closed-mindedness and Loss of executive function    SUICIDE RISK:   Moderate:  Frequent suicidal ideation with limited intensity, and duration, some specificity in terms of plans, no associated intent, good self-control, limited dysphoria/symptomatology, some risk factors present, and identifiable protective factors, including available and accessible social support.   PLAN OF CARE: Patient will be admitted to inpatient psychiatric unit for stabilization and safety. Will provide and encourage milieu participation. Provide medication management and maked adjustments as needed.  Will follow daily.    I certify that inpatient services furnished can reasonably be expected to improve the patient's condition.  Neita Garnet, MD 02/10/2016, 11:56 AM

## 2016-02-10 NOTE — BHH Group Notes (Signed)
The focus of this group is to educate the patient on the purpose and policies of crisis stabilization and provide a format to answer questions about their admission.  The group details unit policies and expectations of patients while admitted. Patient attended the 0900 morning group. Patient was engaging and appropriate.

## 2016-02-10 NOTE — Plan of Care (Signed)
Problem: Coping: Goal: Ability to identify and develop effective coping behavior will improve Outcome: Progressing Pt stated she was feeling better this evening, pt stated she still has to work on getting past the Divorce, but she said she was making progress

## 2016-02-10 NOTE — Plan of Care (Signed)
Problem: Safety: Goal: Periods of time without injury will increase Outcome: Progressing Q 15 minutes checks maintained for safety on and off unit without gestures or reports of self injurious behavior thus far this shift.   Problem: Medication: Goal: Compliance with prescribed medication regimen will improve Outcome: Progressing Pt compliant with medications as prescribed. Verbal education provided prior to administration and pt verbalized understanding.

## 2016-02-10 NOTE — BHH Group Notes (Signed)
[place] LCSW Group Therapy 02/10/2016 1:15 PM  Type of Therapy: Group Therapy- Feelings about Diagnosis  Participation Level: Reserved  Participation Quality:  Appropriate  Affect:  Appropriate  Cognitive: Alert and Oriented   Insight:  Developing   Engagement in Therapy: Developing/Improving and Engaged   Modes of Intervention: Clarification, Confrontation, Discussion, Education, Exploration, Limit-setting, Orientation, Problem-solving, Rapport Building, Art therapist, Socialization and Support  Description of Group:   This group will allow patients to explore their thoughts and feelings about diagnoses they have received. Patients will be guided to explore their level of understanding and acceptance of these diagnoses. Facilitator will encourage patients to process their thoughts and feelings about the reactions of others to their diagnosis, and will guide patients in identifying ways to discuss their diagnosis with significant others in their lives. This group will be process-oriented, with patients participating in exploration of their own experiences as well as giving and receiving support and challenge from other group members.  Summary of Progress/Problems:  Pt participated minimally in group discussion but did express that she has been frustrated regarding outpatient providers not listening to her opinions and concerns about medication. Pt remained attentive throughout discussion.  Therapeutic Modalities:   Cognitive Behavioral Therapy Solution Focused Therapy Motivational Interviewing Relapse Prevention Therapy  Peri Maris, LCSWA 02/10/2016 4:18 PM

## 2016-02-10 NOTE — Progress Notes (Signed)
D: Pt denies SI/HI/AVH. Pt is pleasant and cooperative. Pt appears anxious on approach, but pt seemed to calm down as we talked. Pt plans to get a Therapist when she D/C and plans to stay on her medications, pt said she need to work on getting past the Divorce emotionally.    A: Pt was offered support and encouragement. Pt was given scheduled medications. Pt was encourage to attend groups. Q 15 minute checks were done for safety.   R:Pt attends groups and interacts well with peers and staff. Pt is taking medication. Pt has no complaints.Pt receptive to treatment and safety maintained on unit.

## 2016-02-10 NOTE — Progress Notes (Signed)
Adult Psychoeducational Group Note  Date:  02/10/2016 Time:  9:09 PM  Group Topic/Focus:  Wrap-Up Group:   The focus of this group is to help patients review their daily goal of treatment and discuss progress on daily workbooks.   Participation Level:  Active  Participation Quality:  Appropriate  Affect:  Appropriate  Cognitive:  Appropriate  Insight: Good  Engagement in Group:  Engaged  Modes of Intervention:  Discussion  Additional Comments:  Patient goal was to speak with MD and didn't accomplish her goal but was able to get her meds together. Robin Bruce 02/10/2016, 9:09 PM

## 2016-02-10 NOTE — Progress Notes (Signed)
D: Pt was in the hallway upon initial approach.  Pt has anxious affect and mood.  She reports "I'm okay."  Pt reports her goal is "just being here, my neighbor kind of made me come."  Pt forwards little information to Probation officer.  Pt denies SI/HI, denies hallucinations, denies pain.  Pt has been visible in milieu interacting with select peers and staff appropriately.  Pt attended evening group.    A: Introduced self to pt.  Actively listened to pt and offered support and encouragement.  Medication education provided.  Pt reports feeling uncomfortable taking Xanax 1 mg PO PRN for anxiety.  She reports she usually takes Xanax 0.25 mg or .5 mg as needed for anxiety.  On-site provider, Patriciaann Clan, notified and Xanax 0.5 mg PO PRN at bedtime for anxiety was ordered and administered.    R: Pt is compliant with medication.  Pt verbally contracts for safety.  Will continue to monitor and assess.

## 2016-02-10 NOTE — Tx Team (Signed)
Interdisciplinary Treatment and Diagnostic Plan Update  02/10/2016 Time of Session: 9-10am  Robin Bruce MRN: [id number]  Principal Diagnosis: Major Depressive Disorder, recurrent, severe Secondary Diagnoses: Active Problems:   MDD (major depressive disorder), recurrent episode ([place])   Current Medications:  Current Facility-Administered Medications  Medication Dose Route Frequency Provider Last Rate Last Dose  . acetaminophen (TYLENOL) tablet 650 mg  650 mg Oral Q6H PRN Niel Hummer, NP   650 mg at 02/09/16 1529  . ALPRAZolam Duanne Moron) tablet 0.5 mg  0.5 mg Oral QHS PRN Laverle Hobby, PA-C   0.5 mg at 02/09/16 2158  . alum & mag hydroxide-simeth (MAALOX/MYLANTA) 200-200-20 MG/5ML suspension 30 mL  30 mL Oral Q4H PRN Niel Hummer, NP      . hydrOXYzine (ATARAX/VISTARIL) tablet 25 mg  25 mg Oral Q6H PRN Niel Hummer, NP      . Influenza vac split quadrivalent PF (FLUARIX) injection 0.5 mL  0.5 mL Intramuscular Tomorrow-1000 Fernando A Cobos, MD      . magnesium hydroxide (MILK OF MAGNESIA) suspension 30 mL  30 mL Oral Daily PRN Niel Hummer, NP      . multivitamin with minerals tablet 1 tablet  1 tablet Oral Daily Niel Hummer, NP   1 tablet at 02/10/16 0820  . traZODone (DESYREL) tablet 50 mg  50 mg Oral QHS PRN Niel Hummer, NP       PTA Medications: Prescriptions Prior to Admission  Medication Sig Dispense Refill Last Dose  . ALPRAZolam (XANAX) 0.25 MG tablet Take 0.25 mg by mouth 3 (three) times daily as needed for anxiety.   02/09/2016 at Unknown time  . BIOTIN PO Take 1 capsule by mouth daily.   unknown  . Cholecalciferol (VITAMIN D PO) Take 1 capsule by mouth daily.   unknown  . medroxyPROGESTERone (DEPO-PROVERA) 150 MG/ML injection Inject 150 mg into the muscle every 3 (three) months.   02/04/2016  . Pseudoephedrine-Ibuprofen 30-200 MG TABS Take 1 tablet by mouth every 6 (six) hours as needed (nasal congestion).   unknown  . promethazine (PHENERGAN) 25 MG tablet Take 1  tablet (25 mg total) by mouth every 6 (six) hours as needed for nausea or vomiting. (Patient not taking: Reported on 02/10/2016) 10 tablet 0 Not Taking at Unknown time    Treatment Modalities: Medication Management, Group therapy, Case management,  1 to 1 session with clinician, Psychoeducation, Recreational therapy.   Physician Treatment Plan for Primary Diagnosis: <principal problem not specified> Long Term Goal(s): Improvement in symptoms so as ready for discharge   Short Term Goals: Ability to identify changes in lifestyle to reduce recurrence of condition will improve, Ability to disclose and discuss suicidal ideas, Ability to identify and develop effective coping behaviors will improve and Compliance with prescribed medications will improve  Medication Management: Evaluate patient's response, side effects, and tolerance of medication regimen.  Therapeutic Interventions: 1 to 1 sessions, Unit Group sessions and Medication administration.  Evaluation of Outcomes: Progressing  Physician Treatment Plan for Secondary Diagnosis: Active Problems:   MDD (major depressive disorder), recurrent episode ([place])  Long Term Goal(s): Improvement in symptoms so as ready for discharge  Short Term Goals: Ability to identify changes in lifestyle to reduce recurrence of condition will improve, Ability to verbalize feelings will improve, Ability to identify and develop effective coping behaviors will improve and Compliance with prescribed medications will improve  Medication Management: Evaluate patient's response, side effects, and tolerance of medication regimen.  Therapeutic Interventions: 1  to 1 sessions, Unit Group sessions and Medication administration.  Evaluation of Outcomes: Progressing   RN Treatment Plan for Primary Diagnosis:  Major Depressive Disorder, recurrent severe  Long Term Goal(s): Knowledge of disease and therapeutic regimen to maintain health will improve  Short Term Goals:  Ability to participate in decision making will improve and Compliance with prescribed medications will improve  Medication Management: RN will administer medications as ordered by provider, will assess and evaluate patient's response and provide education to patient for prescribed medication. RN will report any adverse and/or side effects to prescribing provider.  Therapeutic Interventions: 1 on 1 counseling sessions, Psychoeducation, Medication administration, Evaluate responses to treatment, Monitor vital signs and CBGs as ordered, Perform/monitor CIWA, COWS, AIMS and Fall Risk screenings as ordered, Perform wound care treatments as ordered.  Evaluation of Outcomes: Progressing   LCSW Treatment Plan for Primary Diagnosis: Major Depressive Disorder, recurrent severe  Long Term Goal(s): Safe transition to appropriate next level of care at discharge, Engage patient in therapeutic group addressing interpersonal concerns.  Short Term Goals: Engage patient in aftercare planning with referrals and resources, Increase ability to appropriately verbalize feelings, Facilitate patient progression through stages of change regarding substance use diagnoses and concerns and Increase skills for wellness and recovery  Therapeutic Interventions: Assess for all discharge needs, 1 to 1 time with Social worker, Explore available resources and support systems, Assess for adequacy in community support network, Educate family and significant other(s) on suicide prevention, Complete Psychosocial Assessment, Interpersonal group therapy.  Evaluation of Outcomes: Progressing   Progress in Treatment: Attending groups: Yes. Participating in groups: Yes. Taking medication as prescribed: No. Too be evaluated today Toleration medication: No. Family/Significant other contact made: No, will contact:  CSW attempting to make contct with boyfriend Shawn Patient understands diagnosis: Yes. Discussing patient identified  problems/goals with staff: Yes. Medical problems stabilized or resolved: Yes. Denies suicidal/homicidal ideation: Yes. Issues/concerns per patient self-inventory: Yes. Other:   New problem(s) identified: No, Describe:  currently working on short term goals  New Short Term/Long Term Goal(s):  Discharge Plan or Barriers:   Referral for therapy: Madalyn Rob and medication management if patient is agreeable.  Reason for Continuation of Hospitalization: Anxiety Depression Medication stabilization  Estimated Length of Stay:  3-5 days    Attendees: Patient:  02/10/2016 10:33 AM  Physician:  02/10/2016 10:33 AM  Nursing:  02/10/2016 10:33 AM  RN Care Manager: 02/10/2016 10:33 AM  Social Worker: Vidal Schwalbe 02/10/2016 10:33 AM  Recreational Therapist:  02/10/2016 10:33 AM  Other:  02/10/2016 10:33 AM  Other:  02/10/2016 10:33 AM  Other: 02/10/2016 10:33 AM    Scribe for Treatment Team: Lilly Cove, LCSW 02/10/2016 10:33 AM

## 2016-02-10 NOTE — BHH Counselor (Signed)
Adult Comprehensive Assessment  Patient ID: Robin Bruce, female   DOB: 1970/11/29, 45 y.o.   MRN: [id number]  Information Source: Patient (1:1 assessment complete)    Current Stressors:  Educational / Learning stressors: Senior in [employer] getting her degree at [employer], classes 3x a week Employment / Job issues: Full time with overtime work and feeling unappreciated Bereavement / Loss: Divorced husband 3 years ago, never dealt with divorce.  Living/Environment/Situation:  Living Arrangements: Spouse/significant other Living conditions (as described by patient or guardian): Comfortable and stable. 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  No sex, communication was poor.  Husband left patient Additional relationship information: Patient currently in new relationship of 2 years.  Name is Raquel Sarna and he has a child, thus she is a step mother.  Are you sexually active?: Yes What is your sexual orientation?: heterosexual Has your sexual activity been affected by drugs, alcohol, medication, or emotional stress?: none.  Reports she is less sexually active with boyfriend due to depression and emotional stress Does patient have children?: No (step mother to a 9 year old)  Childhood History:  By whom was/is the patient raised?: Mother, Father Additional childhood history information: none reported Description of patient's relationship with caregiver when they were a child: Positive  relationship with mother and father. parents were divorced and she had 2 different blended families Patient's description of current relationship with people who raised him/her: Father has passed away and patient does have a relationship with her mother. Mother has hx of SI and has been hospitalized in the past How were you disciplined when you got in trouble as a child/adolescent?: NA Does patient have siblings?: Yes Number of Siblings: 3 Description of patient's current relationship with siblings: Reports they are all half siblings and she talks to one half brother who lives in [place] and is facebook friends with another brother.  Not close Did patient suffer any verbal/emotional/physical/sexual abuse as a child?: No Did patient suffer from severe childhood neglect?: No Has patient ever been sexually abused/assaulted/raped as an adolescent or adult?: No Was the patient ever a victim of a crime or a disaster?: No Witnessed domestic violence?: No Has patient been effected by domestic violence as an adult?: No  Education:  Highest grade of school patient has completed: Currently a senior in college Currently a student?: Yes If yes, how has current illness impacted academic performance: Patient has been missing school and classes Name of school: [employer] person: NA How long has the patient attended?: 4 years Learning disability?: No  Employment/Work Situation:   Employment situation: Employed 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 How long has patient been employed?: over 10 years Patient's job has been impacted by current illness: Yes Describe how patient's job has been impacted: Patient has been calling out of work, working from home because she reports she cannot get out of bed, tearful and very depressed What is the longest time patient has a held  a job?: current job Where was the patient employed at that time?: current job Has patient ever been in the  [employer]?: No Has patient ever served in combat?: No Did You Receive Any Psychiatric Treatment/Services While in Passenger transport manager?: No Are There Guns or Other Weapons in [place]?: No Are These Weapons Safely Secured?: Yes  Financial Resources:   Financial resources: Income from employment Does patient have a representative payee or guardian?: No  Alcohol/Substance Abuse:   What has been your use of drugs/alcohol within the last 12 months?: reports drinking wine weekly If attempted suicide, did drugs/alcohol play a role in this?: No Alcohol/Substance Abuse Treatment Hx: Denies past history Has alcohol/substance abuse ever caused legal problems?: No  Social Support System:   Patient's Community Support System: Good Describe Community Support System: Patient has good support from mom and boyfriend. Reports many friends and neighbors and they are the ones who brought her to the hospital Type of faith/religion: did not define How does patient's faith help to cope with current illness?: na  Leisure/Recreation:   Leisure and Hobbies: patient reports she has none. Reports she goes to school, work and home  Strengths/Needs:   What things does the patient do well?: reports she takes pride in her work and wanting it to be complete and right. In what areas does patient struggle / problems for patient: Letting go of her Concepcion Elk and divorce.  Tends to push things under the table and not deal with emotions or problems  Discharge Plan:   Does patient have access to transportation?: Yes Will patient be returning to same living situation after discharge?: Yes Currently receiving community mental health services: No If no, would patient like referral for services when discharged?: Yes (What county[place] Sports coach) Does patient have financial barriers related to discharge medications?: No  Summary/Recommendations:   Summary and Recommendations (to be completed by the evaluator): Robin Bruce is an 45  y.o. female who came to [facility] as a walk in with her neighbor. Pt was tearful with rapid speech and shallow breathing. She states that she has been having the following symptoms for the past two weeks: tearfulness, depression, fatigue, anxiety, panic attacks, psychosomatic symptoms such as shooting pains with no physical cause and thoughts of suicide. She states that she has been having thoughts that she "just doesn't want to be here anymore and she just wants to sleep". Neighbor said that she has said something about a plan of cutting her wrists in the past couple of days which pt admitted to as well. She states that she has a family history of suicide attempts and her mom has been hospitalized several times. She states that her friends, family and coworkers have noticed her change in behavior and have been conerned about her. Pt has missed several days of work in the past two weeks which is not typical of her. She states that she "always has it together and never lets anyone see she's struggling". She states that she got a divorce 3 years ago and feels like she never dealt with it. She has been hearing songs that remind her of her ex husband and she feels like it could have triggered some of her anxiety and depression. She states that she has had anxiety and panic attacks her whole life but doesn't typically have depression issues.   Patient wants to address divorce and learn to let go and not cry everyday or when she hears a song on the radio.  Patient would  benefit from crisis stablization, group therapy, trial of mood stablizer, and aftercare planning specifically therapy to address past relationship issues.  Lilly Cove 02/10/2016

## 2016-02-11 LAB — TSH: TSH: 2.153 u[IU]/mL (ref 0.350–4.500)

## 2016-02-11 MED ORDER — ATENOLOL 25 MG PO TABS
25.0000 mg | ORAL_TABLET | Freq: Every day | ORAL | Status: DC
  Administered 2016-02-11 – 2016-02-13 (×3): 25 mg via ORAL
  Filled 2016-02-11 (×5): qty 1

## 2016-02-11 NOTE — BHH Group Notes (Signed)
[facility] LCSW Aftercare Discharge Planning Group Note  02/11/2016 8:45 AM  Participation Quality: Alert, Appropriate and Oriented  Mood/Affect: Reserved  Plan for Discharge/Comments: Pt attended discharge planning group and actively participated in group. CSW discussed suicide prevention education with the group and encouraged them to discuss discharge planning and any relevant barriers. Pt expresses no needs at this time.   Transportation Means: Pt reports access to transportation  Supports: No supports mentioned at this time  Peri Maris, [place] 02/11/2016 9:24 AM

## 2016-02-11 NOTE — BHH Group Notes (Signed)
[place] LCSW Group Therapy 02/11/2016 1:15 PM  Type of Therapy: Group Therapy- Emotion Regulation  Participation Level: Active   Participation Quality:  Appropriate  Affect: Appropriate  Cognitive: Alert and Oriented   Insight:  Developing/Improving  Engagement in Therapy: Developing/Improving and Engaged   Modes of Intervention: Clarification, Confrontation, Discussion, Education, Exploration, Limit-setting, Orientation, Problem-solving, Rapport Building, Art therapist, Socialization and Support  Summary of Progress/Problems: The topic for group today was emotional regulation. This group focused on both positive and negative emotion identification and allowed group members to process ways to identify feelings, regulate negative emotions, and find healthy ways to manage internal/external emotions. Group members were asked to reflect on a time when their reaction to an emotion led to a negative outcome and explored how alternative responses using emotion regulation would have benefited them. Group members were also asked to discuss a time when emotion regulation was utilized when a negative emotion was experienced. Pt was attentive and active in group discussion and was able to identify characteristics of anxiety including the irrational nature of anxious thoughts. She demonstrates developing insight AEB her ability to identify negative thought patterns.    Peri Maris, [place] 02/11/2016 3:42 PM

## 2016-02-11 NOTE — Progress Notes (Signed)
D. Pt presents with an anxious affect and  behavior. Pt currently denies SI/HI and AV hallucinations. Pt reports that she is improving. Pt states, "The fact that I can talk about all this without tearing up tells me that I must be improving."   A. Labs and vitals monitored. Pt supported emotionally and encouraged to express concerns and ask questions.   R. Pt remains safe with 15 minute checks. Will continue POC.

## 2016-02-11 NOTE — Progress Notes (Addendum)
Data. Patient denies SI/HI/AVH. Affect is bright and her mood is somewhat anxious and depressed. She is bright with interaction with peers and staff with multiple smiling and laughing noted. Began her antidepressant medication and reports no adverse effects. On her self assessment she reports, 5/10 for depression, 4/10 for haplessness and 6.5/10 for anxiety. Her goal today is: "Coping mechanisms". After dinner patient received a PRN for anxiety. She reports she is anxious because work colleagues are coming to visit her this evening. Action. Emotional support and encouragement offered. Education provided on medication, indications and side effect. Q 15 minute checks done for safety. Response. Safety on the unit maintained through 15 minute checks.  Medications taken as prescribed. Attended groups. Remained calm and appropriate through out shift.

## 2016-02-11 NOTE — Plan of Care (Signed)
Problem: Education: Goal: Knowledge of the prescribed therapeutic regimen will improve Outcome: Progressing Patient received education on her new antidepressant medication, this shift. She was very receptive to the new information.

## 2016-02-11 NOTE — Progress Notes (Signed)
Recreation Therapy Notes  Date: 02/11/16 Time: 0930 Location: [address] Group Room  Group Topic: Stress Management  Goal Area(s) Addresses:  Patient will verbalize importance of using healthy stress management.  Patient will identify positive emotions associated with healthy stress management.   Behavioral Response: Engaged  Intervention: Stress Management  Activity :  [employer].  LRT introduced the technique of guided imagery to patients.  Patients were to follow along as LRT read script.  LRT read script so patients could participate in the technique.  Education:  Stress Management, Discharge Planning.   Education Outcome: Acknowledges edcuation/In group clarification offered/Needs additional education  Clinical Observations/Feedback: Pt attended group.   Victorino Sparrow, LRT/CTRS         Victorino Sparrow A 02/11/2016 12:13 PM

## 2016-02-11 NOTE — Progress Notes (Signed)
Adult Psychoeducational Group Note  Date:  02/11/2016 Time:  9:59 PM  Group Topic/Focus:  Wrap-Up Group:   The focus of this group is to help patients review their daily goal of treatment and discuss progress on daily workbooks.   Participation Level:  Active  Participation Quality:  Appropriate  Affect:  Appropriate  Cognitive:  Alert  Insight: Appropriate  Engagement in Group:  Engaged  Modes of Intervention:  Discussion  Additional Comments:  Pt stated that today was an ok day. Her goal for tomorrow is to not get upset when someone comes to visit her.  Wynelle Fanny R 02/11/2016, 9:59 PM

## 2016-02-11 NOTE — Progress Notes (Addendum)
[facility] MD Progress Note  02/11/2016 2:01 PM Robin Bruce  MRN:  [id number] Subjective:  Patient states she feels she has made some progress compared to how she felt prior to admission . She remains depressed, but feels she has improved partially, has not felt as tearful as she had been and describes being less ruminative about her stressors, divorce . Denies medication side effects . Objective : I have discussed case with treatment team and have met with patient. Patient remains somewhat depressed, sad, but affect is more reactive today. Not tearful at this time. States she continues to feel vaguely anxious . At this time denies any suicidal ideations . Denies medication side effects . No disruptive or agitated behaviors on unit, going to groups .  Labs- TSH WNL  Principal Problem:  MDD  Diagnosis:   Patient Active Problem List   Diagnosis Date Noted  . Severe episode of recurrent major depressive disorder, without psychotic features ([place]) [F33.2]   . MDD (major depressive disorder), recurrent episode ([place]) [F33.9] 02/09/2016  . 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 Endometriosis [N80.9]    Total Time spent with patient: 20 minutes    Past Medical History:  Past Medical History:  Diagnosis Date  . Abnormal Pap smear   . Brain tumor (benign) ([place])   . Endometriosis   . H/O varicella   . Headache(784.0)    migraines  . History of kidney stones   . HSV-2 infection   . 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 APPENDECTOMY    . BRAIN SURGERY    . LAPAROTOMY    . WISDOM TOOTH EXTRACTION     Family History:  Family History  Problem Relation Age of Onset  . Depression Mother   . Heart disease Father   . Diabetes Maternal Grandfather    Social History:  History  Alcohol Use  . 6.6 oz/week  . 7 Standard drinks or equivalent, 4 Glasses of wine per week    Comment:  socially      History  Drug Use    Social History   Social History  . Marital status: Divorced    Spouse name: N/A  . Number of children: N/A  . Years of education: N/A   Social History Main Topics  . Smoking status: Former Smoker    Quit date: 09/22/2015  . Smokeless tobacco: Never Used  . Alcohol use 6.6 oz/week    7 Standard drinks or equivalent, 4 Glasses of wine per week     Comment: socially   . Drug use:   . Sexual activity: Yes    Partners: Male    Birth control/ protection: Injection   Other Topics Concern  . None   Social History Narrative  . None   Additional Social History:    Pain Medications: no Prescriptions: see MAR Over the Counter: no History of alcohol / drug use?: No history of alcohol / drug abuse  Sleep: improved   Appetite:  Good  Current Medications: Current Facility-Administered Medications  Medication Dose Route Frequency Provider Last Rate Last Dose  . acetaminophen (TYLENOL) tablet 650 mg  650 mg Oral Q6H PRN Niel Hummer, NP   650 mg at 02/11/16 0852  . alum & mag hydroxide-simeth (MAALOX/MYLANTA) 200-200-20 MG/5ML suspension 30 mL  30 mL Oral Q4H PRN Niel Hummer, NP      . atenolol (TENORMIN) tablet  25 mg  25 mg Oral Daily Encarnacion Slates, NP   25 mg at 02/11/16 1215  . citalopram (CELEXA) tablet 20 mg  20 mg Oral Daily Jenne Campus, MD   20 mg at 02/11/16 0847  . hydrOXYzine (ATARAX/VISTARIL) tablet 25 mg  25 mg Oral Q6H PRN Niel Hummer, NP   25 mg at 02/10/16 2143  . LORazepam (ATIVAN) tablet 0.5 mg  0.5 mg Oral Q6H PRN Myer Peer Garlan Drewes, MD      . magnesium hydroxide (MILK OF MAGNESIA) suspension 30 mL  30 mL Oral Daily PRN Niel Hummer, NP      . multivitamin with minerals tablet 1 tablet  1 tablet Oral Daily Niel Hummer, NP   1 tablet at 02/11/16 0847  . traZODone (DESYREL) tablet 50 mg  50 mg Oral QHS PRN Niel Hummer, NP        Lab Results:  Results for orders placed or performed during the hospital encounter of  02/09/16 (from the past 48 hour(s))  Pregnancy, urine     Status: None   Collection Time: 02/09/16  2:08 PM  Result Value Ref Range   Preg Test, Ur NEGATIVE NEGATIVE    Comment:        THE SENSITIVITY OF THIS METHODOLOGY IS >20 mIU/mL. Performed at [facility]   Urine rapid drug screen (hosp performed)     Status: Abnormal   Collection Time: 02/09/16  2:08 PM  Result Value Ref Range   Opiates NONE DETECTED NONE DETECTED   Cocaine NONE DETECTED NONE DETECTED   Benzodiazepines POSITIVE (A) NONE DETECTED   Amphetamines NONE DETECTED NONE DETECTED   Tetrahydrocannabinol NONE DETECTED NONE DETECTED   Barbiturates NONE DETECTED NONE DETECTED    Comment:        DRUG SCREEN FOR MEDICAL PURPOSES ONLY.  IF CONFIRMATION IS NEEDED FOR ANY PURPOSE, NOTIFY LAB WITHIN 5 DAYS.        LOWEST DETECTABLE LIMITS FOR URINE DRUG SCREEN Drug Class       Cutoff (ng/mL) Amphetamine      1000 Barbiturate      200 Benzodiazepine   314 Tricyclics       388 Opiates          300 Cocaine          300 THC              50 Performed at [facility]   TSH     Status: None   Collection Time: 02/11/16  6:10 AM  Result Value Ref Range   TSH 2.153 0.350 - 4.500 uIU/mL    Comment: Performed at [facility]    Blood Alcohol level:  No results found for: [facility]  Metabolic Disorder Labs: No results found for: HGBA1C, MPG No results found for: PROLACTIN No results found for: CHOL, TRIG, HDL, CHOLHDL, VLDL, LDLCALC  Physical Findings: AIMS: Facial and Oral Movements Muscles of Facial Expression: None, normal Lips and Perioral Area: None, normal Jaw: None, normal Tongue: None, normal,Extremity Movements Upper (arms, wrists, hands, fingers): None, normal Lower (legs, knees, ankles, toes): None, normal, Trunk Movements Neck, shoulders, hips: None, normal, Overall Severity Severity of abnormal movements (highest score from questions above): None,  normal Incapacitation due to abnormal movements: None, normal Patient's awareness of abnormal movements (rate only patient's report): No Awareness, Dental Status Current problems with teeth and/or dentures?: No Does patient usually wear dentures?: No  CIWA:  COWS:     Musculoskeletal: Strength & Muscle Tone: within normal limits Gait & Station: normal Patient leans: N/A  Psychiatric Specialty Exam: Physical Exam  ROS no headache, no chest pain, no shortness of breath, no vomiting   Blood pressure 126/89, pulse (!) 101, temperature 98.6 F (37 C), temperature source Oral, resp. rate 16, height 5' 5.5" (1.664 m), weight 163 lb (73.9 kg), last menstrual period 12/24/2015, SpO2 100 %.Body mass index is 26.71 kg/m.  General Appearance: Well Groomed  Eye Contact:  Good  Speech:  Normal Rate  Volume:  Normal  Mood:  improving mood , remains depressed , but feels it is improving   Affect:  still constricted, somewhat anxious, but reactive   Thought Process:  Linear  Orientation:  Full (Time, Place, and Person)  Thought Content:  denies hallucinations, no delusions, not internally preoccupied   Suicidal Thoughts:  No- denies any suicidal ideations, denies any self injurious ideations at this time, contracts for safety on the unit   Homicidal Thoughts:  No  Memory:  recent and remote grossly intact   Judgement:  Other:  improved   Insight:  improved  Psychomotor Activity:  Normal  Concentration:  Concentration: Good and Attention Span: Good  Recall:  Good  Fund of Knowledge:  Good  Language:  Good  Akathisia:  Negative  Handed:  Right  AIMS (if indicated):     Assets:  Desire for Improvement Resilience  ADL's:  Intact  Cognition:  WNL  Sleep:  Number of Hours: 6.75    Assessment - patient reports partial improvement of mood and anxiety symptoms compared to admission presentation, but continues to feel depressed, sad. No suicidal ideations at this time and thus far is  tolerating Celexa trial well.   Treatment P\ Summary: Daily contact with patient to assess and evaluate symptoms and progress in treatment, Medication management, Plan inpatient treatment  and medications as below Encourage ongoing group and milieu participation to work on coping skills and symptom reduction Continue  Celexa 20 mgrs QDAY for depression and anxiety  Continue Trazodone 50 mgrs QHS PRN for insomnia  Continue Ativan 0.5 mgrs  Q 6 hours PRN for anxiety as needed Patient on Tenormin for history of HTN Treatment Team working on disposition planning options Neita Garnet, MD 02/11/2016, 2:01 PM

## 2016-02-12 NOTE — B[facility] Suicide Risk Assessment (Signed)
[place] INPATIENT:  Family/Significant Other Suicide Prevention Education  Suicide Prevention Education:  Education Completed; Ronnald Ramp, Pts significant other [phone], has been identified by the patient as the family member/significant other with whom the patient will be residing, and identified as the person(s) who will aid the patient in the event of a mental health crisis (suicidal ideations/suicide attempt).  With written consent from the patient, the family member/significant other has been provided the following suicide prevention education, prior to the and/or following the discharge of the patient.  The suicide prevention education provided includes the following:  Suicide risk factors  Suicide prevention and interventions  National Suicide Hotline telephone number  [facility] assessment telephone number  [facility] Emergency Assistance [place] and/or Residential Mobile Crisis Unit telephone number  Request made of family/significant other to:  Remove weapons (e.g., guns, rifles, knives), all items previously/currently identified as safety concern.    Remove drugs/medications (over-the-counter, prescriptions, illicit drugs), all items previously/currently identified as a safety concern.  The family member/significant other verbalizes understanding of the suicide prevention education information provided.  The family member/significant other agrees to remove the items of safety concern listed above.  Pt's significant other feels that Pt has made significant progress and is hopeful for her recovery. Pt SO plans to support her when she is discharged and encourage her in recovery.   Bo Mcclintock 02/12/2016, 9:17 AM

## 2016-02-12 NOTE — Progress Notes (Signed)
Pt did not attend karaoke group this evening.  

## 2016-02-12 NOTE — Progress Notes (Signed)
Adult Psychoeducational Group Note  Date:  02/12/2016 Time:  0900 am  Group Topic/Focus:  Orientation:   The focus of this group is to educate the patient on the purpose and policies of crisis stabilization and provide a format to answer questions about their admission.  The group details unit policies and expectations of patients while admitted.   Participation Level:  Active  Participation Quality:  Appropriate  Affect:  Appropriate  Cognitive:  Appropriate  Insight: Appropriate  Engagement in Group:  Engaged  Modes of Intervention:  Education and Orientation  Additional Comments:   Umair Rosiles L 02/12/2016, 4:48 PM

## 2016-02-12 NOTE — Progress Notes (Signed)
Adult Psychoeducational Group Note  Date:  02/12/2016 Time:  11:48 AM  Group Topic/Focus:  Identifying Needs:   The focus of this group is to help patients identify their personal needs that have been historically problematic and identify healthy behaviors to address their needs.   Participation Level:  Active  Participation Quality:  Appropriate  Affect:  Appropriate  Cognitive:  Appropriate  Insight: Good  Engagement in Group:  Engaged  Modes of Intervention:  Discussion  Additional Comments:  Pt was active in group this morning.  Pt states that her triggers are being in tight spaces.   Pt states that counting in her head helps her to deal with her anxiety and is looking forward to outpatient therapy. Nnenna Meador R Pascha Fogal 02/12/2016, 11:48 AM

## 2016-02-12 NOTE — Progress Notes (Signed)
D: Pt presents anxious on approach. Pt reported feeling increasingly anxious this morning, denies any triggers to anxiety. Pt reported decreasing depression today  4/10. Pt denied suicidal thought and verbally contracts for safety. Pt stated that she would like to speak to the doctor about FMLA paper work and returning back to work. Pt compliant with taking meds and attending groups. No side effects to meds verbalized by pt. A: Medications reviewed with pt. Mediations administered as ordered per MD. Verbal support provided. Pt encouraged to attend groups. 15 minute checks performed for safety. R: Pt receptive to tx.

## 2016-02-12 NOTE — [facility] Group Notes (Signed)
[facility] Group Therapy 02/12/2016 1:15pm  Type of Therapy: [facility] Presentation  Participation Level: Active  Participation Quality: Attentive  Affect: Appropriate  Cognitive: Oriented  Insight: Developing/Improving  Engagement in Therapy: Engaged  Modes of Intervention: Discussion, Education and Socialization  Summary of Progress/Problems: [facility] ([place]) Speaker came to talk about his personal journey with substance abuse and addiction. The pt processed ways by which to relate to the speaker. [place] speaker provided handouts and educational information pertaining to groups and services offered by the [facility]. Pt was engaged in speaker's presentation and was receptive to resources provided.    Peri Maris, LCSWA 02/12/2016 1:52 PM

## 2016-02-12 NOTE — Progress Notes (Signed)
[facility] MD Progress Note  02/12/2016 2:02 PM LELIANA KONTZ  MRN:  [id number] Subjective:  Patient states she feels better than on admission , and is starting to focus more on discharging soon and on disposition planning . States she does continue to have some anxiety, particularly when she thinks about returning to work soon, but states " it is not that bad, and my work is great. I know I'll be OK". Reports good visits from BF , mother, friend, feels she has a good support network. Denies  medication side effects . Objective : I have discussed case with treatment team and have met with patient. She reports partial but noticeable improvement of her mood and anxiety symptoms. She does present calmer, not restless or agitated, and with an improved mood and range of affect . States she has been less depressed, and in particular notes that she has been less prone to crying, and states " I don't feel like I want to cry all the time".  No disruptive or agitated behaviors on unit, going to groups , participating actively in these.  Tolerating medication ( Celexa ) well thus far- denies side effects.  Principal Problem:  MDD  Diagnosis:   Patient Active Problem List   Diagnosis Date Noted  . Severe episode of recurrent major depressive disorder, without psychotic features ([place]) [F33.2]   . MDD (major depressive disorder), recurrent episode ([place]) [F33.9] 02/09/2016  . 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 Endometriosis [N80.9]    Total Time spent with patient: 20 minutes    Past Medical History:  Past Medical History:  Diagnosis Date  . Abnormal Pap smear   . Brain tumor (benign) ([place])   . Endometriosis   . H/O varicella   . Headache(784.0)    migraines  . History of kidney stones   . HSV-2 infection   . 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 APPENDECTOMY    . BRAIN SURGERY    .  LAPAROTOMY    . WISDOM TOOTH EXTRACTION     Family History:  Family History  Problem Relation Age of Onset  . Depression Mother   . Heart disease Father   . Diabetes Maternal Grandfather    Social History:  History  Alcohol Use  . 6.6 oz/week  . 7 Standard drinks or equivalent, 4 Glasses of wine per week    Comment: socially      History  Drug Use    Social History   Social History  . Marital status: Divorced    Spouse name: N/A  . Number of children: N/A  . Years of education: N/A   Social History Main Topics  . Smoking status: Former Smoker    Quit date: 09/22/2015  . Smokeless tobacco: Never Used  . Alcohol use 6.6 oz/week    7 Standard drinks or equivalent, 4 Glasses of wine per week     Comment: socially   . Drug use:   . Sexual activity: Yes    Partners: Male    Birth control/ protection: Injection   Other Topics Concern  . None   Social History Narrative  . None   Additional Social History:    Pain Medications: no Prescriptions: see MAR Over the Counter: no History of alcohol / drug use?: No history of alcohol / drug abuse  Sleep: improved   Appetite:  Good  Current Medications: Current Facility-Administered  Medications  Medication Dose Route Frequency Provider Last Rate Last Dose  . acetaminophen (TYLENOL) tablet 650 mg  650 mg Oral Q6H PRN Niel Hummer, NP   650 mg at 02/11/16 0852  . alum & mag hydroxide-simeth (MAALOX/MYLANTA) 200-200-20 MG/5ML suspension 30 mL  30 mL Oral Q4H PRN Niel Hummer, NP      . atenolol (TENORMIN) tablet 25 mg  25 mg Oral Daily Encarnacion Slates, NP   25 mg at 02/12/16 0826  . citalopram (CELEXA) tablet 20 mg  20 mg Oral Daily Jenne Campus, MD   20 mg at 02/12/16 0826  . LORazepam (ATIVAN) tablet 0.5 mg  0.5 mg Oral Q6H PRN Jenne Campus, MD   0.5 mg at 02/11/16 1815  . magnesium hydroxide (MILK OF MAGNESIA) suspension 30 mL  30 mL Oral Daily PRN Niel Hummer, NP      . multivitamin with minerals tablet 1  tablet  1 tablet Oral Daily Niel Hummer, NP   1 tablet at 02/12/16 [phone]  . traZODone (DESYREL) tablet 50 mg  50 mg Oral QHS PRN Niel Hummer, NP        Lab Results:  Results for orders placed or performed during the hospital encounter of 02/09/16 (from the past 48 hour(s))  TSH     Status: None   Collection Time: 02/11/16  6:10 AM  Result Value Ref Range   TSH 2.153 0.350 - 4.500 uIU/mL    Comment: Performed at [facility]    Blood Alcohol level:  No results found for: [facility]  Metabolic Disorder Labs: No results found for: HGBA1C, MPG No results found for: PROLACTIN No results found for: CHOL, TRIG, HDL, CHOLHDL, VLDL, LDLCALC  Physical Findings: AIMS: Facial and Oral Movements Muscles of Facial Expression: None, normal Lips and Perioral Area: None, normal Jaw: None, normal Tongue: None, normal,Extremity Movements Upper (arms, wrists, hands, fingers): None, normal Lower (legs, knees, ankles, toes): None, normal, Trunk Movements Neck, shoulders, hips: None, normal, Overall Severity Severity of abnormal movements (highest score from questions above): None, normal Incapacitation due to abnormal movements: None, normal Patient's awareness of abnormal movements (rate only patient's report): No Awareness, Dental Status Current problems with teeth and/or dentures?: No Does patient usually wear dentures?: No  CIWA:    COWS:     Musculoskeletal: Strength & Muscle Tone: within normal limits Gait & Station: normal Patient leans: N/A  Psychiatric Specialty Exam: Physical Exam  ROS no headache, no chest pain, no shortness of breath, no vomiting   Blood pressure 110/81, pulse 96, temperature 98.2 F (36.8 C), temperature source Oral, resp. rate 17, height 5' 5.5" (1.664 m), weight 163 lb (73.9 kg), last menstrual period 12/24/2015, SpO2 100 %.Body mass index is 26.71 kg/m.  General Appearance: Well Groomed  Eye Contact:  Good  Speech:  Normal Rate  Volume:   Normal  Mood:  Gradually improving mood, states she is feeling better than on admission   Affect:  Less constricted, affect more reactive,  vaguely anxious   Thought Process:  Linear  Orientation:  Full (Time, Place, and Person)  Thought Content:  denies hallucinations, no delusions, not internally preoccupied   Suicidal Thoughts:  No- denies any suicidal ideations, denies any self injurious ideations at this time, contracts for safety on the unit   Homicidal Thoughts:  No  Memory:  recent and remote grossly intact   Judgement:  Other:  improved   Insight:  improved  Psychomotor Activity:  Normal  Concentration:  Concentration: Good and Attention Span: Good  Recall:  Good  Fund of Knowledge:  Good  Language:  Good  Akathisia:  Negative  Handed:  Right  AIMS (if indicated):     Assets:  Desire for Improvement Resilience  ADL's:  Intact  Cognition:  WNL  Sleep:  Number of Hours: 6.5    Assessment - patient reports improvement , states she is feeling better than prior to admission, and does present with improved mood and range of affect . Denies any suicidal ideations, is future oriented, focusing on disposition and when she might return to work..   Treatment P\ Summary: Daily contact with patient to assess and evaluate symptoms and progress in treatment, Medication management, Plan inpatient treatment  and medications as below Encourage ongoing group and milieu participation to work on coping skills and symptom reduction Continue  Celexa 20 mgrs QDAY for depression and anxiety  Continue Trazodone 50 mgrs QHS PRN for insomnia  Continue Ativan 0.5 mgrs  Q 6 hours PRN for anxiety as needed Continue Tenormin for history of HTN Treatment Team working on disposition planning options Neita Garnet, MD 02/12/2016, 2:02 PMPatient ID: Kizzie Bane, female   DOB: 1970/10/19, 45 y.o.   MRN: [id number]

## 2016-02-13 MED ORDER — CITALOPRAM HYDROBROMIDE 20 MG PO TABS: 20.0000 mg | ORAL_TABLET | Freq: Every day | ORAL | 0 refills | Status: DC

## 2016-02-13 MED ORDER — ATENOLOL 25 MG PO TABS: 25.0000 mg | ORAL_TABLET | Freq: Every day | ORAL | 0 refills | Status: AC

## 2016-02-13 MED ORDER — TRAZODONE HCL 50 MG PO TABS: 50.0000 mg | ORAL_TABLET | Freq: Every evening | ORAL | 0 refills | Status: DC | PRN

## 2016-02-13 NOTE — Tx Team (Signed)
Interdisciplinary Treatment and Diagnostic Plan Update  02/13/2016 Time of Session: 9-10am  Robin Bruce MRN: [id number]  Principal Diagnosis: Major Depressive Disorder, recurrent, severe  Secondary Diagnoses: Active Problems:   MDD (major depressive disorder), recurrent episode (HCC)   Severe episode of recurrent major depressive disorder, without psychotic features ([place])   Current Medications:  Current Facility-Administered Medications  Medication Dose Route Frequency Provider Last Rate Last Dose  . acetaminophen (TYLENOL) tablet 650 mg  650 mg Oral Q6H PRN Niel Hummer, NP   650 mg at 02/11/16 0852  . alum & mag hydroxide-simeth (MAALOX/MYLANTA) 200-200-20 MG/5ML suspension 30 mL  30 mL Oral Q4H PRN Niel Hummer, NP      . atenolol (TENORMIN) tablet 25 mg  25 mg Oral Daily Encarnacion Slates, NP   25 mg at 02/13/16 0818  . citalopram (CELEXA) tablet 20 mg  20 mg Oral Daily Jenne Campus, MD   20 mg at 02/13/16 0818  . LORazepam (ATIVAN) tablet 0.5 mg  0.5 mg Oral Q6H PRN Jenne Campus, MD   0.5 mg at 02/12/16 1631  . magnesium hydroxide (MILK OF MAGNESIA) suspension 30 mL  30 mL Oral Daily PRN Niel Hummer, NP      . multivitamin with minerals tablet 1 tablet  1 tablet Oral Daily Niel Hummer, NP   1 tablet at 02/13/16 0818  . traZODone (DESYREL) tablet 50 mg  50 mg Oral QHS PRN Niel Hummer, NP       PTA Medications: Prescriptions Prior to Admission  Medication Sig Dispense Refill Last Dose  . ALPRAZolam (XANAX) 0.25 MG tablet Take 0.25 mg by mouth 3 (three) times daily as needed for anxiety.   02/09/2016 at Unknown time  . BIOTIN PO Take 1 capsule by mouth daily.   unknown  . Cholecalciferol (VITAMIN D PO) Take 1 capsule by mouth daily.   unknown  . medroxyPROGESTERone (DEPO-PROVERA) 150 MG/ML injection Inject 150 mg into the muscle every 3 (three) months.   02/04/2016  . Pseudoephedrine-Ibuprofen 30-200 MG TABS Take 1 tablet by mouth every 6 (six) hours as needed (nasal  congestion).   unknown  . promethazine (PHENERGAN) 25 MG tablet Take 1 tablet (25 mg total) by mouth every 6 (six) hours as needed for nausea or vomiting. (Patient not taking: Reported on 02/10/2016) 10 tablet 0 Not Taking at Unknown time    Treatment Modalities: Medication Management, Group therapy, Case management,  1 to 1 session with clinician, Psychoeducation, Recreational therapy.   Physician Treatment Plan for Primary Diagnosis: Major Depressive Disorder, recurrent, severe Long Term Goal(s): Improvement in symptoms so as ready for discharge   Short Term Goals: Ability to identify changes in lifestyle to reduce recurrence of condition will improve, Ability to disclose and discuss suicidal ideas, Ability to identify and develop effective coping behaviors will improve and Compliance with prescribed medications will improve  Medication Management: Evaluate patient's response, side effects, and tolerance of medication regimen.  Therapeutic Interventions: 1 to 1 sessions, Unit Group sessions and Medication administration.  Evaluation of Outcomes: Adequate for Discharge  Physician Treatment Plan for Secondary Diagnosis: Active Problems:   MDD (major depressive disorder), recurrent episode (HCC)   Severe episode of recurrent major depressive disorder, without psychotic features ([place])  Long Term Goal(s): Improvement in symptoms so as ready for discharge  Short Term Goals: Ability to identify changes in lifestyle to reduce recurrence of condition will improve, Ability to verbalize feelings will improve, Ability to identify  and develop effective coping behaviors will improve and Compliance with prescribed medications will improve  Medication Management: Evaluate patient's response, side effects, and tolerance of medication regimen.  Therapeutic Interventions: 1 to 1 sessions, Unit Group sessions and Medication administration.  Evaluation of Outcomes: Adequate for Discharge   RN Treatment  Plan for Primary Diagnosis:  Major Depressive Disorder, recurrent severe  Long Term Goal(s): Knowledge of disease and therapeutic regimen to maintain health will improve  Short Term Goals: Ability to participate in decision making will improve and Compliance with prescribed medications will improve  Medication Management: RN will administer medications as ordered by provider, will assess and evaluate patient's response and provide education to patient for prescribed medication. RN will report any adverse and/or side effects to prescribing provider.  Therapeutic Interventions: 1 on 1 counseling sessions, Psychoeducation, Medication administration, Evaluate responses to treatment, Monitor vital signs and CBGs as ordered, Perform/monitor CIWA, COWS, AIMS and Fall Risk screenings as ordered, Perform wound care treatments as ordered.  Evaluation of Outcomes: Adequate for Discharge   LCSW Treatment Plan for Primary Diagnosis: Major Depressive Disorder, recurrent severe  Long Term Goal(s): Safe transition to appropriate next level of care at discharge, Engage patient in therapeutic group addressing interpersonal concerns.  Short Term Goals: Engage patient in aftercare planning with referrals and resources, Increase ability to appropriately verbalize feelings, Facilitate patient progression through stages of change regarding substance use diagnoses and concerns and Increase skills for wellness and recovery  Therapeutic Interventions: Assess for all discharge needs, 1 to 1 time with Social worker, Explore available resources and support systems, Assess for adequacy in community support network, Educate family and significant other(s) on suicide prevention, Complete Psychosocial Assessment, Interpersonal group therapy.  Evaluation of Outcomes: Adequate for Discharge   Progress in Treatment: Attending groups: Yes. Participating in groups: Yes. Taking medication as prescribed: No. Too be evaluated  today Toleration medication: No. Family/Significant other contact made: Yes with boyfriend Patient understands diagnosis: Yes. Discussing patient identified problems/goals with staff: Yes. Medical problems stabilized or resolved: Yes. Denies suicidal/homicidal ideation: Yes. Issues/concerns per patient self-inventory: Yes. Other:   New problem(s) identified: None identified at this time.   New Short Term/Long Term Goal(s):  Discharge Plan or Barriers:   Referral for therapy: Madalyn Rob and medication management if patient is agreeable.   02/13/2016: Pt will return home and follow-up with outpatient services.   Reason for Continuation of Hospitalization: Anxiety Depression Medication stabilization  Estimated Length of Stay:  0 days    Attendees: Patient:  02/13/2016 10:44 AM  Physician: Dr. Parke Poisson, MD 02/13/2016 10:44 AM  Nursing: Rockwell Germany, RN 02/13/2016 10:44 AM  RN Care Manager: 02/13/2016 10:44 AM  Social Worker: Peri Maris, LCSW 02/13/2016 10:44 AM  Recreational Therapist:  02/13/2016 10:44 AM  Other:  02/13/2016 10:44 AM  Other:  02/13/2016 10:44 AM  Other: 02/13/2016 10:44 AM    Scribe for Treatment Team: Bo Mcclintock, LCSW 02/13/2016 10:44 AM

## 2016-02-13 NOTE — Progress Notes (Signed)
Educated patient on Discharge instruction, follow up appointments, and medications. Patient has signed all documents. Have all belongings. Patient attended groups before leaving.

## 2016-02-13 NOTE — Progress Notes (Signed)
MD notes patient is ready for discharge

## 2016-02-13 NOTE — Progress Notes (Signed)
Recreation Therapy Notes  Date: 02/13/16 Time: 0930 Location: 300 Hall Group Room  Group Topic: Stress Management  Goal Area(s) Addresses:  Patient will verbalize importance of using healthy stress management.  Patient will identify positive emotions associated with healthy stress management.   Behavioral Response: Engaged  Intervention: Stress Management  Activity :  [facility].  LRT introduced the concept of guided imagery.  LRT read a script to allow patients to participate in activity.  Patients were to follow along as LRT read script.  Education:  Stress Management, Discharge Planning.   Education Outcome: Acknowledges edcuation/In group clarification offered/Needs additional education  Clinical Observations/Feedback:  Pt attended group.   Victorino Sparrow, LRT/CTRS         Victorino Sparrow A 02/13/2016 12:45 PM

## 2016-02-13 NOTE — BHH Suicide Risk Assessment (Signed)
[facility] Discharge Suicide Risk Assessment   Principal Problem: MDD (major depressive disorder), recurrent episode [facility]) Discharge Diagnoses:  Patient Active Problem List   Diagnosis Date Noted  . Severe episode of recurrent major depressive disorder, without psychotic features ([place]) [F33.2]   . MDD (major depressive disorder), recurrent episode ([place]) [F33.9] 02/09/2016  . 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 Endometriosis [N80.9]     Total Time spent with patient: 30 minutes  Musculoskeletal: Strength & Muscle Tone: within normal limits Gait & Station: normal Patient leans: N/A  Psychiatric Specialty Exam: ROS no headache, no chest pain, no shortness of breath, no vomiting   Blood pressure 113/78, pulse 93, temperature 98 F (36.7 C), temperature source Oral, resp. rate 16, height 5' 5.5" (1.664 m), weight 163 lb (73.9 kg), last menstrual period 12/24/2015, SpO2 100 %.Body mass index is 26.71 kg/m.  General Appearance: Well Groomed  Eye Contact::  Good  Speech:  Normal [id number]  Volume:  Normal  Mood:  improving mood, describes mood as better.   Affect:  Appropriate and reactive, fuller in range  Thought Process:  Linear  Orientation:  Full (Time, Place, and Person)  Thought Content:  no hallucinations, no delusions, not internally preoccupied   Suicidal Thoughts:  No- denies any suicidal or self injurious ideations , denies any homicidal or violent ideations   Homicidal Thoughts:  No  Memory:  recent and remote grossly intact   Judgement:  Other:  improved   Insight:  Present  Psychomotor Activity:  Normal  Concentration:  Good  Recall:  Good  Fund of Knowledge:Good  Language: Good  Akathisia:  Negative  Handed:  Right  AIMS (if indicated):     Assets:  Desire for Improvement Resilience  Sleep:  Number of Hours: 6.75  Cognition: WNL  ADL's:  Intact   Mental Status Per Nursing Assessment::   On Admission:  NA  Demographic Factors:   45 year old female, divorced, no children, employed   Loss Factors: divorce, work related stressors   Historical Factors: history of depression  Risk Reduction Factors:   Sense of responsibility to family, Living with another person, especially a relative, Positive social support and Positive coping skills or problem solving skills  Continued Clinical Symptoms:  Patient is alert, attentive , well related, mood significantly improved, affect more reactive, fuller in range, no thought disorder, no suicidal or self injurious ideations, no hallucinations, no delusions, future oriented. Denies medication side effects. Denies medication side effects.   Cognitive Features That Contribute To Risk:  No gross cognitive deficits noted upon discharge. Is alert , attentive, and oriented x 3   Suicide Risk:  Mild:  Suicidal ideation of limited frequency, intensity, duration, and specificity.  There are no identifiable plans, no associated intent, mild dysphoria and related symptoms, good self-control (both objective and subjective assessment), few other risk factors, and identifiable protective factors, including available and accessible social support.  Follow-up Information    Madalyn Rob [facility] Therapist. Go on 02/19/2016.   Why:  Follow up with therapist Appointment is at 4:00pm Contact information: [place],  [address] [place] Follow up on 03/17/2016.   Specialty:  Behavioral Health Why:  at 8:00am with Dr. Modesta Messing for medication management. Please bring your insurance card with you. Contact information: [address] [phone] [place] at [facility] Follow up on 02/26/2016.   Why:  at 2:30pm for medication management with [place], [place]. Please arrive at 2:15pm. This will serve as a bridge appointment since your first  psychiatry appointment is over the 30-day time frame for your prescriptions Contact information: [place], [place] [address] Call: [phone] Fax: [phone]          Plan Of Care/Follow-up recommendations:  Activity:  as tolerated  Diet:  Heart Healthy  Tests:  NA Other:  See below   Patient is leaving unit in good spirits  Plans to return home  Plans to return to work next week Plans to follow up as above   Neita Garnet, MD 02/13/2016, 12:18 PM

## 2016-02-13 NOTE — Discharge Summary (Signed)
Physician Discharge Summary Note  Patient:  Robin Bruce is an 45 y.o., female MRN:  [id number] DOB:  09/01/1970 Patient phone:  [phone] (home)  Patient address:   [address] [place] [address],  Total Time spent with patient: 30 minutes  Date of Admission:  02/09/2016 Date of Discharge: 02/13/2016  Reason for Admission:  Worsening depression   Principal Problem: MDD (major depressive disorder), recurrent episode [facility]) Discharge Diagnoses: Patient Active Problem List   Diagnosis Date Noted  . MDD (major depressive disorder), recurrent episode ([place]) [F33.9] 02/09/2016    Priority: High  . Severe episode of recurrent major depressive disorder, without psychotic features ([place]) [F33.2]   . 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 Endometriosis [N80.9]     Past Psychiatric History: see HPI  Past Medical History:  Past Medical History:  Diagnosis Date  . Abnormal Pap smear   . Brain tumor (benign) ([place])   . Endometriosis   . H/O varicella   . Headache(784.0)    migraines  . History of kidney stones   . HSV-2 infection   . 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 APPENDECTOMY    . BRAIN SURGERY    . LAPAROTOMY    . WISDOM TOOTH EXTRACTION     Family History:  Family History  Problem Relation Age of Onset  . Depression Mother   . Heart disease Father   . Diabetes Maternal Grandfather    Family Psychiatric  History: see HPI Social History:  History  Alcohol Use  . 6.6 oz/week  . 7 Standard drinks or equivalent, 4 Glasses of wine per week    Comment: socially      History  Drug Use    Social History   Social History  . Marital status: Divorced    Spouse name: N/A  . Number of children: N/A  . Years of education: N/A   Social History Main Topics  . Smoking status: Former Smoker    Quit date: 09/22/2015  . Smokeless tobacco: Never Used  .  Alcohol use 6.6 oz/week    7 Standard drinks or equivalent, 4 Glasses of wine per week     Comment: socially   . Drug use:   . Sexual activity: Yes    Partners: Male    Birth control/ protection: Injection   Other Topics Concern  . None   Social History Narrative  . None    Hospital Course:  Robin Bruce, 45 yo female, with hx of Major depression, recurrent episodes. Was Admitted to [facility] reporting that she had been crying uncontrollably, self-isolating, missing work for many weeks now. My symptoms worsened in the last 2 weeks.  She was separated for 3 years from my marriage of 26 years, divorced now for 18 months. Patient reports that she had been on antidepressant in the past, had bad reaction (excessive weakness, dizziness, tingling sensation), stopped taking it, 8 years ago.   Robin Bruce was admitted for MDD (major depressive disorder), recurrent episode ([place]) and crisis management.  Patient was treated with medications with their indications listed below in detail under Medication List.  Medical problems were identified and treated as needed.  Home medications were restarted as appropriate.  Improvement was monitored by observation and [place] daily report of symptom reduction.  Emotional and mental status was monitored by daily self inventory reports completed by Kizzie Bane and  clinical staff.  Patient reported continued improvement, denied any new concerns.  Patient had been compliant on medications and denied side effects.  Support and encouragement was provided.    Robin Bruce was evaluated by the treatment team for stability and plans for continued recovery upon discharge.  Patient was offered further treatment options upon discharge including Residential, Intensive Outpatient and Outpatient treatment. Patient will follow up with agency listed below for medication management and counseling.  Encouraged patient to maintain satisfactory support network and home  environment.  Advised to adhere to medication compliance and outpatient treatment follow up.  Prescriptions provided.       Robin Bruce motivation was an integral factor for scheduling further treatment.  Employment, transportation, bed availability, health status, family support, and any pending legal issues were also considered during patient's hospital stay.  Upon completion of this admission the patient was both mentally and medically stable for discharge denying suicidal/homicidal ideation, auditory/visual/tactile hallucinations, delusional thoughts and paranoia.      Physical Findings: AIMS: Facial and Oral Movements Muscles of Facial Expression: None, normal Lips and Perioral Area: None, normal Jaw: None, normal Tongue: None, normal,Extremity Movements Upper (arms, wrists, hands, fingers): None, normal Lower (legs, knees, ankles, toes): None, normal, Trunk Movements Neck, shoulders, hips: None, normal, Overall Severity Severity of abnormal movements (highest score from questions above): None, normal Incapacitation due to abnormal movements: None, normal Patient's awareness of abnormal movements (rate only patient's report): No Awareness, Dental Status Current problems with teeth and/or dentures?: No Does patient usually wear dentures?: No  CIWA:    COWS:     Musculoskeletal: Strength & Muscle Tone: within normal limits Gait & Station: normal Patient leans: N/A  Psychiatric Specialty Exam:  See MD SRA Physical Exam  ROS  Blood pressure 113/78, pulse 93, temperature 98 F (36.7 C), temperature source Oral, resp. rate 16, height 5' 5.5" (1.664 m), weight 73.9 kg (163 lb), last menstrual period 12/24/2015, SpO2 100 %.Body mass index is 26.71 kg/m.    Have you used any form of tobacco in the last 30 days? (Cigarettes, Smokeless Tobacco, Cigars, and/or Pipes): No  Has this patient used any form of tobacco in the last 30 days? (Cigarettes, Smokeless Tobacco, Cigars, and/or  Pipes) Yes, N/A  Blood Alcohol level:  No results found for: [facility]  Metabolic Disorder Labs:  No results found for: HGBA1C, MPG No results found for: PROLACTIN No results found for: CHOL, TRIG, HDL, CHOLHDL, VLDL, LDLCALC  See Psychiatric Specialty Exam and Suicide Risk Assessment completed by Attending Physician prior to discharge.  Discharge destination:  Home  Is patient on multiple antipsychotic therapies at discharge:  No   Has Patient had three or more failed trials of antipsychotic monotherapy by history:  No  Recommended Plan for Multiple Antipsychotic Therapies: NA    Medication List    STOP taking these medications   ALPRAZolam 0.25 MG tablet Commonly known as:  XANAX   BIOTIN PO   promethazine 25 MG tablet Commonly known as:  PHENERGAN   Pseudoephedrine-Ibuprofen 30-200 MG Tabs   VITAMIN D PO     TAKE these medications     Indication  atenolol 25 MG tablet Commonly known as:  TENORMIN Take 1 tablet (25 mg total) by mouth daily. Start taking on:  02/14/2016  Indication:  High Blood Pressure Disorder   citalopram 20 MG tablet Commonly known as:  CELEXA Take 1 tablet (20 mg total) by mouth daily. Start taking on:  02/14/2016  Indication:  Depression   traZODone 50 MG tablet Commonly known as:  DESYREL Take 1 tablet (50 mg total) by mouth at bedtime as needed for sleep.  Indication:  Trouble Sleeping     ASK your doctor about these medications     Indication  medroxyPROGESTERone 150 MG/ML injection Commonly known as:  DEPO-PROVERA Inject 150 mg into the muscle every 3 (three) months.       Follow-up Information    Madalyn Rob [facility] Therapist. Go on 02/19/2016.   Why:  Follow up with therapist Appointment is at 4:00pm Contact information: [place], [place] [address] [place] Follow up on 03/17/2016.   Specialty:  Behavioral Health Why:  at 8:00am with Dr. Modesta Messing for  medication management. Please bring your insurance card with you. Contact information: [address] [phone] mc [address] [place] at [facility] Follow up on 02/26/2016.   Why:  at 2:30pm for medication management with [place], [place]. Please arrive at 2:15pm. This will serve as a bridge appointment since your first psychiatry appointment is over the 30-day time frame for your prescriptions Contact information: [place], [place] [address] Call: [phone] Fax: [phone]          Follow-up recommendations:  Activity:  as tol Diet:  as tol  Comments:  1.  Take all your medications as prescribed.   2.  Report any adverse side effects to outpatient provider. 3.  Patient instructed to not use alcohol or illegal drugs while on prescription medicines. 4.  In the event of worsening symptoms, instructed patient to call 911, the crisis hotline or go to nearest emergency room for evaluation of symptoms.  Signed: Janett Labella, NP [facility] 02/13/2016, 12:01 PM   Patient seen, Suicide Assessment Completed.  Disposition Plan Reviewed

## 2016-02-13 NOTE — Progress Notes (Signed)
D: Pt denies SI/HI/AVH. Pt is pleasant and cooperative. Pt stated she was ready to D/C . Pt appeared anxious, but pt has been appropriate on the unit this evening.   A: Pt was offered support and encouragement. Pt was given scheduled medications. Pt was encourage to attend groups. Q 15 minute checks were done for safety.   R:Pt attends groups and interacts well with peers and staff. Pt is taking medication. Pt has no complaints.Pt receptive to treatment and safety maintained on unit.

## 2016-02-13 NOTE — Progress Notes (Signed)
  [facility] Adult Case Management Discharge Plan :  Will you be returning to the same living situation after discharge:  Yes,  Pt returning home At discharge, do you have transportation home?: Yes,  Pt boyfriend to pick up Do you have the ability to pay for your medications: Yes,  Pt provided with prescriptions  Release of information consent forms completed and in the chart;  Patient's signature needed at discharge.  Patient to Follow up at: Follow-up Information    Madalyn Rob [facility] Therapist. Go on 02/19/2016.   Why:  Follow up with therapist Appointment is at 4:00pm Contact information: [place], [place] [address] [place] Follow up on 03/17/2016.   Specialty:  Behavioral Health Why:  at 8:00am with Dr. Modesta Messing for medication management. Please bring your insurance card with you. Contact information: [address] [phone] mc [address] [place] at [facility] Follow up on 02/26/2016.   Why:  at 2:30pm for medication management with [place], [place]. Please arrive at 2:15pm. This will serve as a bridge appointment since your first psychiatry appointment is over the 30-day time frame for your prescriptions Contact information: [place], [place] [address] Call: [phone] Fax: [phone]          Next level of care provider has access to [place] and Suicide Prevention discussed: Yes,  with boyfriend; see SPE note  Have you used any form of tobacco in the last 30 days? (Cigarettes, Smokeless Tobacco, Cigars, and/or Pipes): No  Has patient been referred to the Quitline?: N/A patient is not a smoker  Patient has been referred for addiction treatment: N/A  Bo Mcclintock 02/13/2016, 10:47 AM

## 2016-02-13 NOTE — Progress Notes (Signed)
Patient A/O, no noted distress. Denies SI/HI/AVH. Patient ready to go home. Educated patient on medications. Patient states she plan to keep appointments and stay on top of her medications. At this time she does have any other questions. Staff will continue to monitor, meet needs, and maintain safety.
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
## 2016-03-17 ENCOUNTER — Encounter (HOSPITAL_COMMUNITY): Payer: Self-pay | Admitting: Psychiatry

## 2016-03-17 ENCOUNTER — Ambulatory Visit (INDEPENDENT_AMBULATORY_CARE_PROVIDER_SITE_OTHER): Payer: BLUE CROSS/BLUE SHIELD | Admitting: Psychiatry

## 2016-03-17 ENCOUNTER — Encounter (INDEPENDENT_AMBULATORY_CARE_PROVIDER_SITE_OTHER): Payer: Self-pay

## 2016-03-17 VITALS — BP 122/74 | HR 66 | Ht 66.0 in | Wt 162.4 lb

## 2016-03-17 DIAGNOSIS — Z8249 Family history of ischemic heart disease and other diseases of the circulatory system: Secondary | ICD-10-CM

## 2016-03-17 DIAGNOSIS — F331 Major depressive disorder, recurrent, moderate: Secondary | ICD-10-CM

## 2016-03-17 DIAGNOSIS — Z87891 Personal history of nicotine dependence: Secondary | ICD-10-CM | POA: Diagnosis not present

## 2016-03-17 DIAGNOSIS — F321 Major depressive disorder, single episode, moderate: Secondary | ICD-10-CM | POA: Diagnosis not present

## 2016-03-17 DIAGNOSIS — R45851 Suicidal ideations: Secondary | ICD-10-CM

## 2016-03-17 DIAGNOSIS — Z818 Family history of other mental and behavioral disorders: Secondary | ICD-10-CM | POA: Diagnosis not present

## 2016-03-17 DIAGNOSIS — Z79899 Other long term (current) drug therapy: Secondary | ICD-10-CM

## 2016-03-17 MED ORDER — LORAZEPAM 0.5 MG PO TABS: 0.5000 mg | ORAL_TABLET | Freq: Two times a day (BID) | ORAL | 0 refills | Status: AC | PRN

## 2016-03-17 MED ORDER — MIRTAZAPINE 7.5 MG PO TABS: ORAL_TABLET | ORAL | 0 refills | Status: DC

## 2016-03-17 NOTE — Patient Instructions (Signed)
1. Decrease celexa 10 mg daily for one week, then discontinue 2. Start mirtazapine 7.5 mg at night for one week, then 15 mg at night 3. Discontinue Xanax 4. Start lorazepam 0.5 mg twice a day as needed for anxiety 5. Return to clinic in one month

## 2016-04-07 DIAGNOSIS — F321 Major depressive disorder, single episode, moderate: Secondary | ICD-10-CM | POA: Diagnosis not present

## 2016-04-13 DIAGNOSIS — F321 Major depressive disorder, single episode, moderate: Secondary | ICD-10-CM | POA: Diagnosis not present

## 2016-04-13 NOTE — Progress Notes (Signed)
[place] MD/PA/NP OP Progress Note  04/19/2016 10:44 AM Robin Bruce  MRN:  [id number]  Chief Complaint:  Chief Complaint    Depression; Follow-up     Subjective:  "I am doing better" HPI:  She states that she feels better. She states that the side effects of feeling tense improved after she decreased citalopram to 10 mg. She did not start mirtazapine. She feels less anxious and feels good overall except she is having a cold. She had a visit from her mother who is in [place] on Thanksgiving day. She has been sleeping well while on Nyquil. She has panic attack a few times, and finds lorazepam to be helpful. She did not take Ativan last week. She reports good appetite. She denies SI.   Visit Diagnosis:    ICD-9-CM ICD-10-CM   1. Moderate episode of recurrent major depressive disorder (HCC) 296.32 F33.1     Past Psychiatric History:  Outpatient: once, 8 years ago when she experienced loss of her father, animals Psychiatry admission: 9/18-9/22/2017 at [facility] Previous suicide attempt: denies, denies SIB Past trials of medication: Celexa (sexual side effect),   Past Medical History:  Past Medical History:  Diagnosis Date  . Abnormal Pap smear   . Brain tumor (benign) ([place])   . Endometriosis   . H/O varicella   . Headache(784.0)    migraines  . History of kidney stones   . HSV-2 infection   . 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 APPENDECTOMY    . BRAIN SURGERY    . LAPAROTOMY    . WISDOM TOOTH EXTRACTION      Family Psychiatric History:  Father- paranoid delusion, alcohol , mother- depression, alcohol, attempted suicide  Family History:  Family History  Problem Relation Age of Onset  . Depression Mother   . Heart disease Father   . Diabetes Maternal Grandfather     Social History:  Social History   Social History  . Marital status: Divorced    Spouse name: N/A  . Number of children: N/A   . Years of education: N/A   Social History Main Topics  . Smoking status: Former Smoker    Quit date: 09/22/2015  . Smokeless tobacco: Never Used  . Alcohol use 6.6 oz/week    7 Standard drinks or equivalent, 4 Glasses of wine per week     Comment: socially   . Drug use:   . Sexual activity: Yes    Partners: Male    Birth control/ protection: Injection   Other Topics Concern  . Not on file   Social History Narrative  . 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 Codeine Other (See Comments)    Cramps    Metabolic Disorder Labs: No results found for: HGBA1C, MPG No results found for: PROLACTIN No results found for: CHOL, TRIG, HDL, CHOLHDL, VLDL, LDLCALC   Current Medications: Current Outpatient Prescriptions  Medication Sig Dispense Refill  . atenolol (TENORMIN) 25 MG tablet Take 1 tablet (25 mg total) by mouth daily. 30 tablet 0  . citalopram (CELEXA) 10 MG tablet Take 1 tablet (10 mg total) by mouth daily. 30 tablet 1  . LORazepam (ATIVAN) 0.5 MG tablet Take 1 tablet (0.5 mg total) by mouth 2 (  two) times daily as needed for anxiety. 30 tablet 0  . medroxyPROGESTERone (DEPO-PROVERA) 150 MG/ML injection Inject 150 mg into the muscle every 3 (three) months.     No current facility-administered medications for this visit.     Neurologic: Headache: No Seizure: No Paresthesias: No  Musculoskeletal: Strength & Muscle Tone: within normal limits Gait & Station: normal Patient leans: N/A  Psychiatric Specialty Exam: Review of Systems  Psychiatric/Behavioral: Negative for depression, hallucinations, substance abuse and suicidal ideas. The patient is nervous/anxious. The patient does not have insomnia.     Blood pressure 136/82, pulse 95, height 5\' 6"  (1.676 m), weight 163 lb 12.8 oz  (74.3 kg).Body mass index is 26.44 kg/m.  General Appearance: Casual  Eye Contact:  Good  Speech:  Clear and Coherent  Volume:  Normal  Mood:  "better"  Affect:  less anxious  Thought Process:  Coherent and Goal Directed  Orientation:  Full (Time, Place, and Person)  Thought Content: Logical Perceptions: denies AH/VH  Suicidal Thoughts:  No  Homicidal Thoughts:  No  Memory:  Immediate;   Good Recent;   Good Remote;   Good  Judgement:  Good  Insight:  Good  Psychomotor Activity:  Normal  Concentration:  Concentration: Good and Attention Span: Good  Recall:  Good  Fund of Knowledge: Good  Language: Good  Akathisia:  NA  Handed:  Right  AIMS (if indicated):  N/A  Assets:  Communication Skills Desire for Improvement  ADL's:  Intact  Cognition: WNL  Sleep:  good    Assessment Robin Bruce is a 45 year old female with depression, benign brain tumor who presented to clinic to establish care. She had a recent admission to [facility] (9/18-9/22/2017) for depression. Psychosocial stressors including current relationship and divorce last year.   # MDD There has been an improvement in her neurovegetative symptoms and anxiety since the last encounter. Will continue current regimen given she denies adverse events while on lower dose of Celexa. Will continue lorazepam prn ;  risks including dependence and oversedation is discussed. Patient will greatly benefit from CBT to target her anticipatory anxiety as well as supportive therapy to process loss of her relationship; she will continue to see her therapist.   Plan 1. Continue citalopram 10 mg daily 2. Continue lorazepam 0.5 mg twice a day as needed for anxiety 3. Return to clinic in January  Treatment Plan Summary: Plan as above  The patient demonstrates the following risk factors for suicide: Chronic risk factors for suicide include: psychiatric disorder of depression. Acute risk factors for suicide include: family or marital  conflict, loss (financial, interpersonal, professional) and recent discharge from inpatient psychiatry. Protective factors for this patient include: positive social support, positive therapeutic relationship, coping skills and hope for the future. Considering these factors, the overall suicide risk at this point appears to be low. Patient is appropriate for outpatient follow up. Emergency resources which includes calling 911, coming to ED are discussed.   Norman Clay, MD 04/19/2016, 10:44 AM

## 2016-04-19 ENCOUNTER — Ambulatory Visit (INDEPENDENT_AMBULATORY_CARE_PROVIDER_SITE_OTHER): Payer: BLUE CROSS/BLUE SHIELD | Admitting: Psychiatry

## 2016-04-19 VITALS — BP 136/82 | HR 95 | Ht 66.0 in | Wt 163.8 lb

## 2016-04-19 DIAGNOSIS — F331 Major depressive disorder, recurrent, moderate: Secondary | ICD-10-CM

## 2016-04-19 DIAGNOSIS — Z833 Family history of diabetes mellitus: Secondary | ICD-10-CM

## 2016-04-19 DIAGNOSIS — Z818 Family history of other mental and behavioral disorders: Secondary | ICD-10-CM | POA: Diagnosis not present

## 2016-04-19 DIAGNOSIS — Z8249 Family history of ischemic heart disease and other diseases of the circulatory system: Secondary | ICD-10-CM

## 2016-04-19 DIAGNOSIS — Z888 Allergy status to other drugs, medicaments and biological substances status: Secondary | ICD-10-CM

## 2016-04-19 DIAGNOSIS — Z9889 Other specified postprocedural states: Secondary | ICD-10-CM | POA: Diagnosis not present

## 2016-04-19 DIAGNOSIS — Z79899 Other long term (current) drug therapy: Secondary | ICD-10-CM

## 2016-04-19 DIAGNOSIS — Z87891 Personal history of nicotine dependence: Secondary | ICD-10-CM

## 2016-04-19 MED ORDER — CITALOPRAM HYDROBROMIDE 10 MG PO TABS: 10.0000 mg | ORAL_TABLET | Freq: Every day | ORAL | 1 refills | Status: AC

## 2016-04-19 NOTE — Patient Instructions (Signed)
1. Continue citalopram 10 mg daily 2. Continue lorazepam 0.5 mg twice a day as needed for anxiety 3. Return to clinic in January

## 2016-04-20 DIAGNOSIS — Z3009 Encounter for other general counseling and advice on contraception: Secondary | ICD-10-CM | POA: Diagnosis not present

## 2016-05-03 DIAGNOSIS — F321 Major depressive disorder, single episode, moderate: Secondary | ICD-10-CM | POA: Diagnosis not present

## 2016-05-11 DIAGNOSIS — F321 Major depressive disorder, single episode, moderate: Secondary | ICD-10-CM | POA: Diagnosis not present

## 2016-05-26 DIAGNOSIS — F321 Major depressive disorder, single episode, moderate: Secondary | ICD-10-CM | POA: Diagnosis not present

## 2016-06-02 DIAGNOSIS — Z124 Encounter for screening for malignant neoplasm of cervix: Secondary | ICD-10-CM | POA: Diagnosis not present

## 2016-06-02 DIAGNOSIS — Z1231 Encounter for screening mammogram for malignant neoplasm of breast: Secondary | ICD-10-CM | POA: Diagnosis not present

## 2016-06-02 DIAGNOSIS — Z01419 Encounter for gynecological examination (general) (routine) without abnormal findings: Secondary | ICD-10-CM | POA: Diagnosis not present

## 2016-06-02 DIAGNOSIS — Z3040 Encounter for surveillance of contraceptives, unspecified: Secondary | ICD-10-CM | POA: Diagnosis not present

## 2016-06-07 ENCOUNTER — Ambulatory Visit (HOSPITAL_COMMUNITY): Payer: Self-pay | Admitting: Psychiatry

## 2016-06-08 DIAGNOSIS — F321 Major depressive disorder, single episode, moderate: Secondary | ICD-10-CM | POA: Diagnosis not present

## 2016-06-18 DIAGNOSIS — F321 Major depressive disorder, single episode, moderate: Secondary | ICD-10-CM | POA: Diagnosis not present

## 2016-07-05 DIAGNOSIS — D225 Melanocytic nevi of trunk: Secondary | ICD-10-CM | POA: Diagnosis not present

## 2016-07-05 DIAGNOSIS — Z1283 Encounter for screening for malignant neoplasm of skin: Secondary | ICD-10-CM | POA: Diagnosis not present

## 2016-07-05 DIAGNOSIS — L821 Other seborrheic keratosis: Secondary | ICD-10-CM | POA: Diagnosis not present

## 2016-07-05 DIAGNOSIS — Z809 Family history of malignant neoplasm, unspecified: Secondary | ICD-10-CM | POA: Diagnosis not present

## 2016-07-08 DIAGNOSIS — F321 Major depressive disorder, single episode, moderate: Secondary | ICD-10-CM | POA: Diagnosis not present

## 2016-07-12 DIAGNOSIS — Z3042 Encounter for surveillance of injectable contraceptive: Secondary | ICD-10-CM | POA: Diagnosis not present

## 2016-07-21 DIAGNOSIS — R51 Headache: Secondary | ICD-10-CM | POA: Diagnosis not present

## 2016-07-29 DIAGNOSIS — F321 Major depressive disorder, single episode, moderate: Secondary | ICD-10-CM | POA: Diagnosis not present

## 2016-08-10 DIAGNOSIS — F321 Major depressive disorder, single episode, moderate: Secondary | ICD-10-CM | POA: Diagnosis not present

## 2016-08-30 DIAGNOSIS — F321 Major depressive disorder, single episode, moderate: Secondary | ICD-10-CM | POA: Diagnosis not present

## 2016-09-07 DIAGNOSIS — F321 Major depressive disorder, single episode, moderate: Secondary | ICD-10-CM | POA: Diagnosis not present

## 2016-09-28 DIAGNOSIS — F321 Major depressive disorder, single episode, moderate: Secondary | ICD-10-CM | POA: Diagnosis not present

## 2016-10-01 DIAGNOSIS — Z3009 Encounter for other general counseling and advice on contraception: Secondary | ICD-10-CM | POA: Diagnosis not present

## 2016-10-05 DIAGNOSIS — F321 Major depressive disorder, single episode, moderate: Secondary | ICD-10-CM | POA: Diagnosis not present

## 2016-10-22 DIAGNOSIS — F321 Major depressive disorder, single episode, moderate: Secondary | ICD-10-CM | POA: Diagnosis not present

## 2016-11-02 DIAGNOSIS — F321 Major depressive disorder, single episode, moderate: Secondary | ICD-10-CM | POA: Diagnosis not present

## 2016-11-16 DIAGNOSIS — F321 Major depressive disorder, single episode, moderate: Secondary | ICD-10-CM | POA: Diagnosis not present

## 2016-11-30 DIAGNOSIS — F321 Major depressive disorder, single episode, moderate: Secondary | ICD-10-CM | POA: Diagnosis not present

## 2016-12-13 DIAGNOSIS — H40013 Open angle with borderline findings, low risk, bilateral: Secondary | ICD-10-CM | POA: Diagnosis not present

## 2016-12-13 DIAGNOSIS — H53423 Scotoma of blind spot area, bilateral: Secondary | ICD-10-CM | POA: Diagnosis not present

## 2016-12-13 DIAGNOSIS — H04123 Dry eye syndrome of bilateral lacrimal glands: Secondary | ICD-10-CM | POA: Diagnosis not present

## 2016-12-21 DIAGNOSIS — Z3009 Encounter for other general counseling and advice on contraception: Secondary | ICD-10-CM | POA: Diagnosis not present

## 2016-12-24 DIAGNOSIS — F418 Other specified anxiety disorders: Secondary | ICD-10-CM | POA: Diagnosis not present

## 2016-12-24 DIAGNOSIS — F410 Panic disorder [episodic paroxysmal anxiety] without agoraphobia: Secondary | ICD-10-CM | POA: Diagnosis not present

## 2016-12-28 DIAGNOSIS — F321 Major depressive disorder, single episode, moderate: Secondary | ICD-10-CM | POA: Diagnosis not present

## 2017-01-13 DIAGNOSIS — M7732 Calcaneal spur, left foot: Secondary | ICD-10-CM | POA: Diagnosis not present

## 2017-01-13 DIAGNOSIS — M71572 Other bursitis, not elsewhere classified, left ankle and foot: Secondary | ICD-10-CM | POA: Diagnosis not present

## 2017-01-13 DIAGNOSIS — M79672 Pain in left foot: Secondary | ICD-10-CM | POA: Diagnosis not present

## 2017-01-13 DIAGNOSIS — M722 Plantar fascial fibromatosis: Secondary | ICD-10-CM | POA: Diagnosis not present

## 2017-01-18 DIAGNOSIS — F321 Major depressive disorder, single episode, moderate: Secondary | ICD-10-CM | POA: Diagnosis not present

## 2017-02-01 DIAGNOSIS — F321 Major depressive disorder, single episode, moderate: Secondary | ICD-10-CM | POA: Diagnosis not present

## 2017-02-15 DIAGNOSIS — F321 Major depressive disorder, single episode, moderate: Secondary | ICD-10-CM | POA: Diagnosis not present

## 2017-03-01 DIAGNOSIS — F321 Major depressive disorder, single episode, moderate: Secondary | ICD-10-CM | POA: Diagnosis not present

## 2017-03-07 DIAGNOSIS — Z23 Encounter for immunization: Secondary | ICD-10-CM | POA: Diagnosis not present

## 2017-03-07 DIAGNOSIS — Z0000 Encounter for general adult medical examination without abnormal findings: Secondary | ICD-10-CM | POA: Diagnosis not present

## 2017-03-07 DIAGNOSIS — Z13220 Encounter for screening for lipoid disorders: Secondary | ICD-10-CM | POA: Diagnosis not present

## 2017-03-07 DIAGNOSIS — E559 Vitamin D deficiency, unspecified: Secondary | ICD-10-CM | POA: Diagnosis not present

## 2017-03-15 DIAGNOSIS — F321 Major depressive disorder, single episode, moderate: Secondary | ICD-10-CM | POA: Diagnosis not present

## 2017-03-16 DIAGNOSIS — Z3009 Encounter for other general counseling and advice on contraception: Secondary | ICD-10-CM | POA: Diagnosis not present

## 2017-03-29 DIAGNOSIS — F321 Major depressive disorder, single episode, moderate: Secondary | ICD-10-CM | POA: Diagnosis not present

## 2017-04-12 DIAGNOSIS — F321 Major depressive disorder, single episode, moderate: Secondary | ICD-10-CM | POA: Diagnosis not present

## 2017-05-10 DIAGNOSIS — F321 Major depressive disorder, single episode, moderate: Secondary | ICD-10-CM | POA: Diagnosis not present

## 2017-05-23 IMAGING — US US ABDOMEN COMPLETE
1 series · 14 of 25 positions shown · non-contrast
Comparison: Ultrasound dated 09/13/2007

CLINICAL DATA: Epigastric abdominal pain.

EXAM:
ULTRASOUND ABDOMEN COMPLETE

[Series 1: us abdomen complete · 0.30mm/px · 14 of 61 slices shown]
[im 1/61]
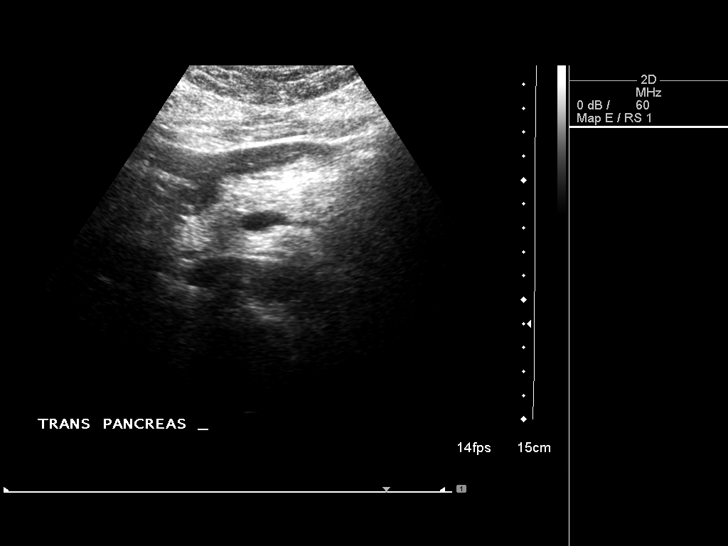
[im 6/61]
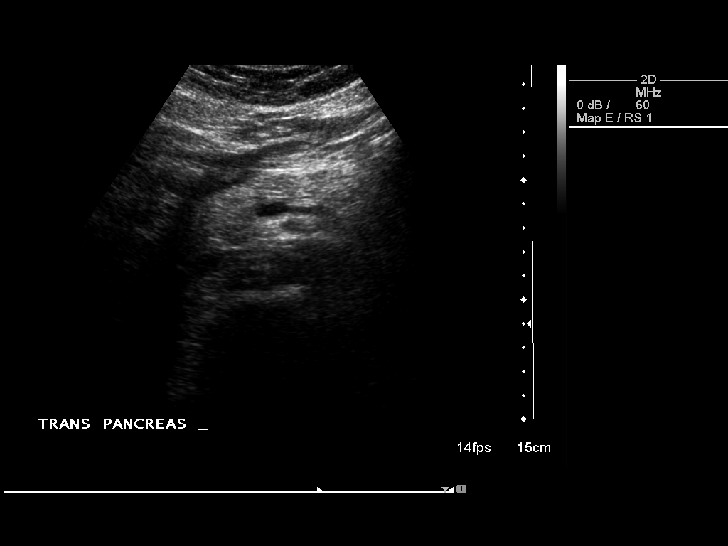
[im 11/61]
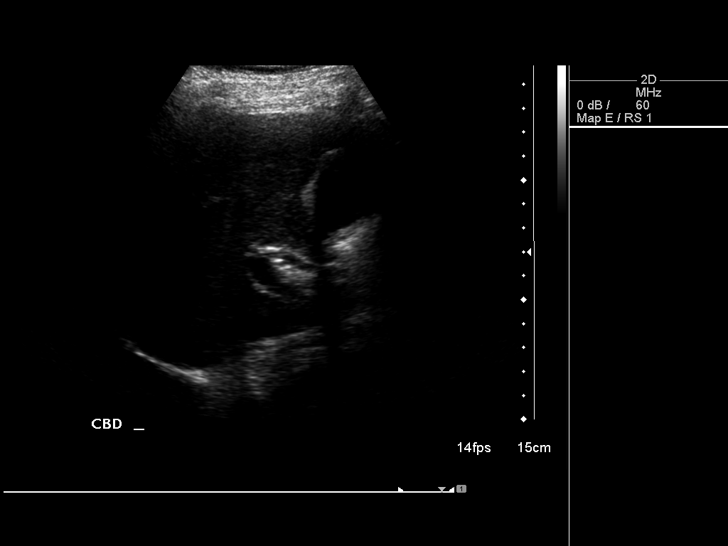
[im 16/61]
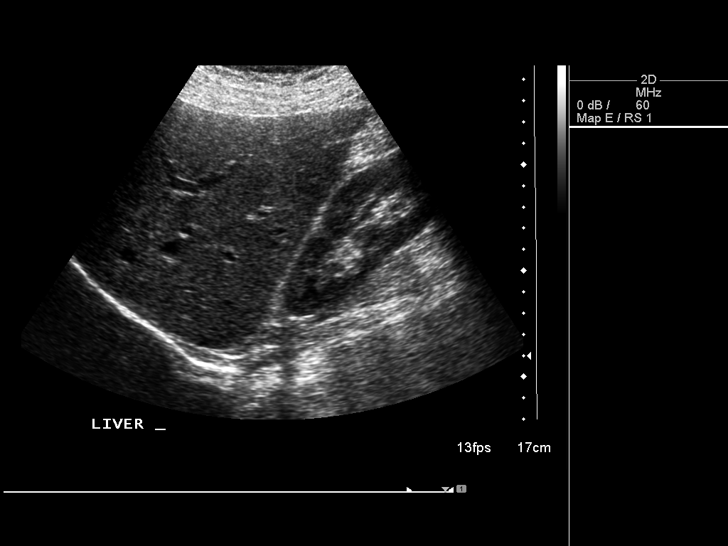
[im 21/61]
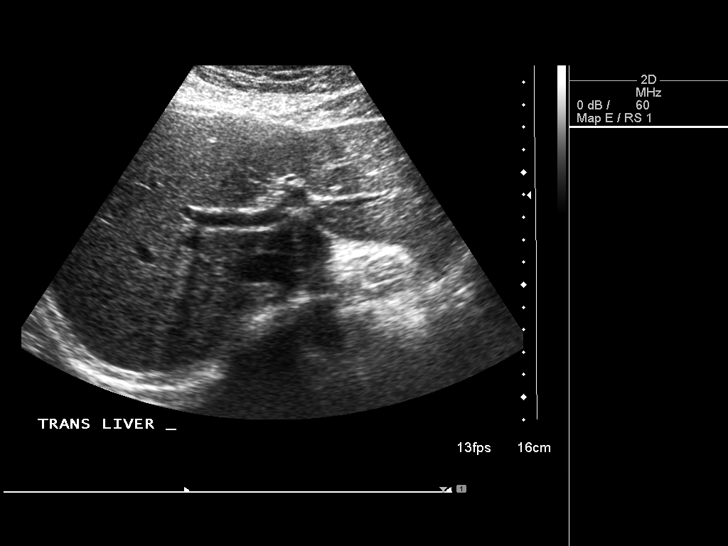
[im 23/61]
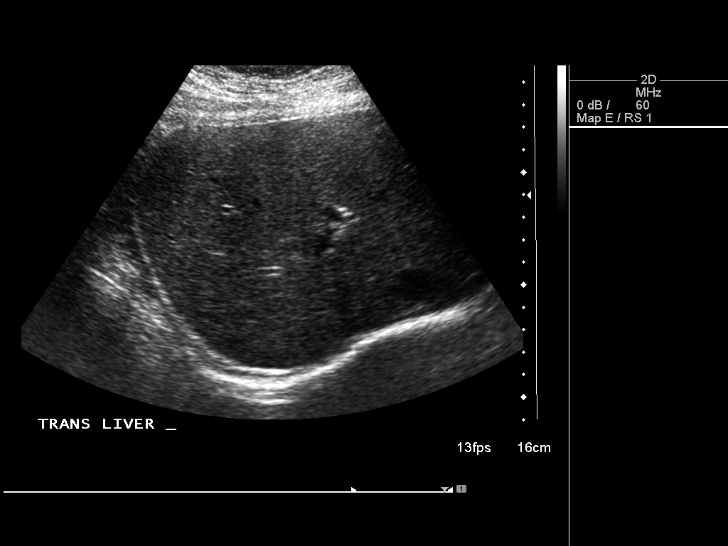
[im 28/61]
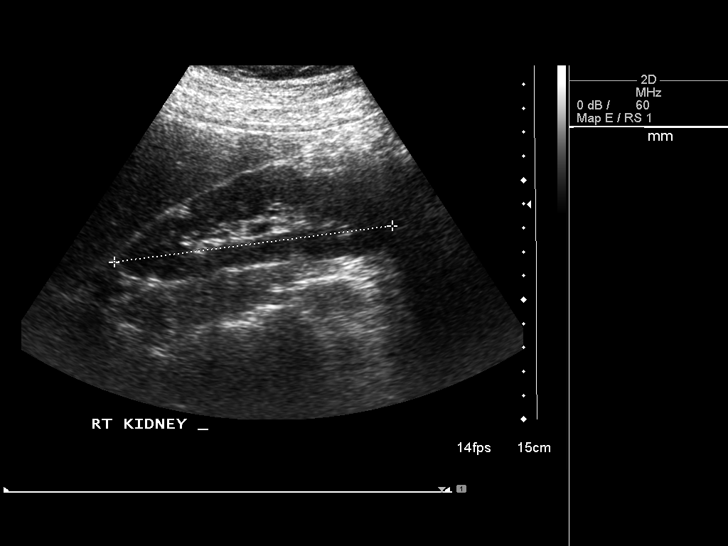
[im 33/61]
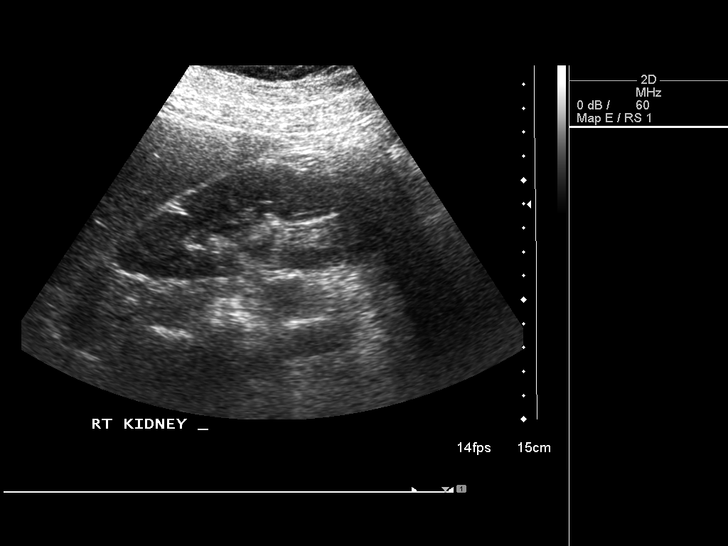
[im 38/61]
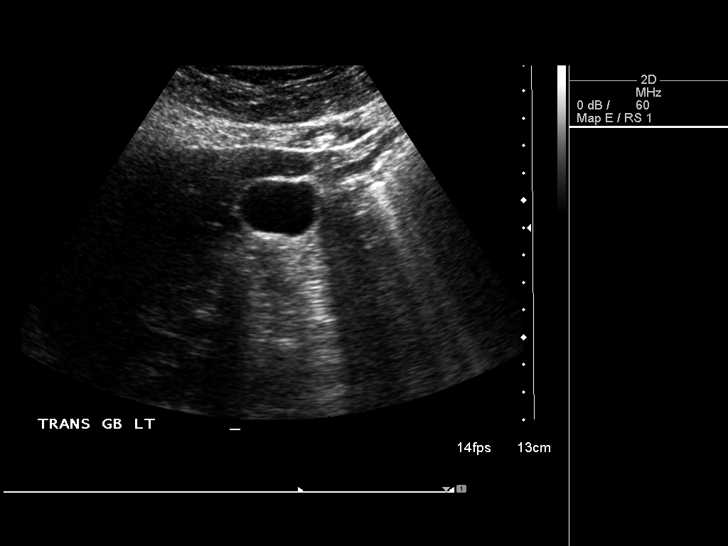
[im 41/61]
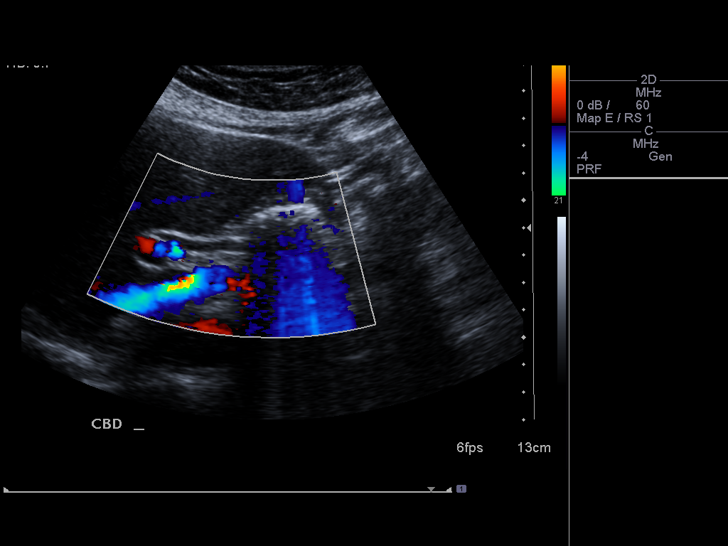
[im 46/61]
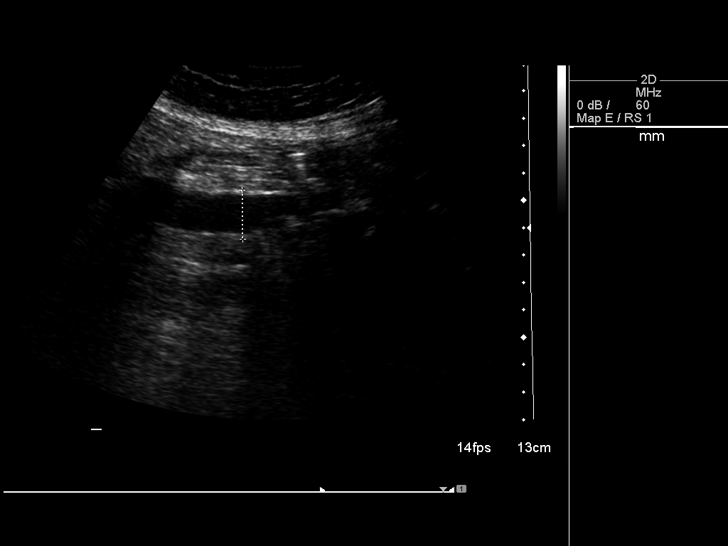
[im 51/61]
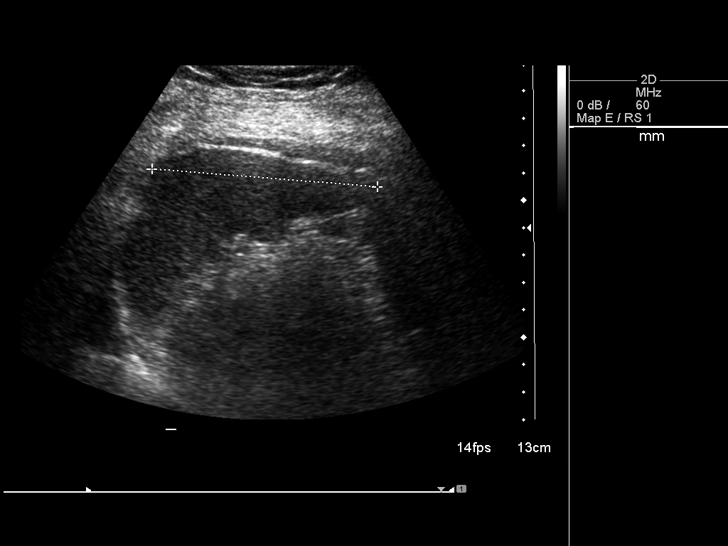
[im 56/61]
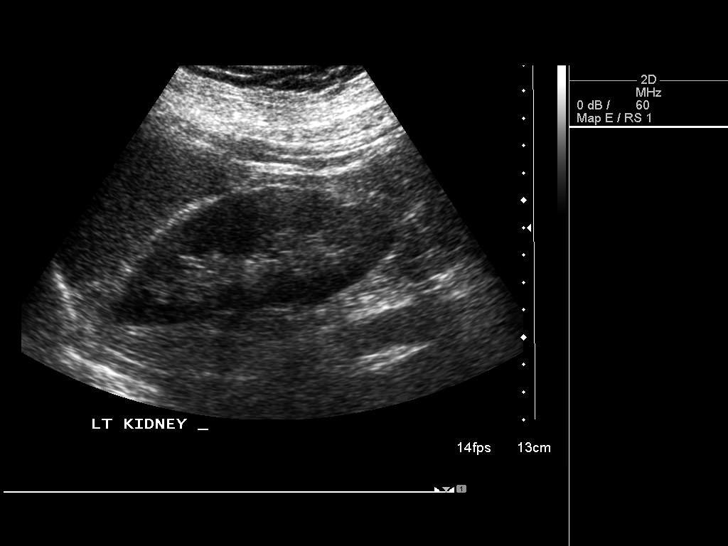
[im 61/61]
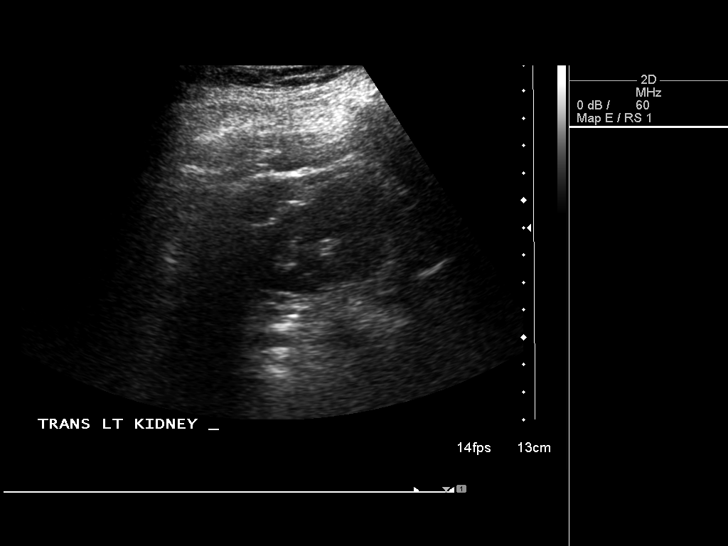

[14 of 25 positions shown; findings below may reference images not displayed]

FINDINGS: Gallbladder: No gallstones or wall thickening visualized. No
sonographic Murphy sign noted.

Common bile duct: Diameter: 4.7 mm, normal.

Liver: No focal lesion identified. Within normal limits in
parenchymal echogenicity.

IVC: No abnormality visualized.

Pancreas: Visualized portion unremarkable.

Spleen: Size and appearance within normal limits.  8.2 cm in length.

Right Kidney: Length: 11.7 cm. Echogenicity within normal limits. No
mass or hydronephrosis visualized.

Left Kidney: Length: 10.9 cm. Echogenicity within normal limits. No
mass or hydronephrosis visualized.

Abdominal aorta: No aneurysm visualized.  Maximal diameter 2 cm.

Other findings: None.
IMPRESSION: Normal upper abdominal ultrasound.

## 2017-06-06 DIAGNOSIS — J0101 Acute recurrent maxillary sinusitis: Secondary | ICD-10-CM | POA: Diagnosis not present

## 2017-06-14 DIAGNOSIS — Z1231 Encounter for screening mammogram for malignant neoplasm of breast: Secondary | ICD-10-CM | POA: Diagnosis not present

## 2017-06-14 DIAGNOSIS — Z3040 Encounter for surveillance of contraceptives, unspecified: Secondary | ICD-10-CM | POA: Diagnosis not present

## 2017-06-14 DIAGNOSIS — Z124 Encounter for screening for malignant neoplasm of cervix: Secondary | ICD-10-CM | POA: Diagnosis not present

## 2017-06-14 DIAGNOSIS — Z6825 Body mass index (BMI) 25.0-25.9, adult: Secondary | ICD-10-CM | POA: Diagnosis not present

## 2017-06-14 DIAGNOSIS — Z01419 Encounter for gynecological examination (general) (routine) without abnormal findings: Secondary | ICD-10-CM | POA: Diagnosis not present

## 2017-07-14 DIAGNOSIS — F321 Major depressive disorder, single episode, moderate: Secondary | ICD-10-CM | POA: Diagnosis not present

## 2017-08-11 DIAGNOSIS — F321 Major depressive disorder, single episode, moderate: Secondary | ICD-10-CM | POA: Diagnosis not present

## 2017-08-13 DIAGNOSIS — R300 Dysuria: Secondary | ICD-10-CM | POA: Diagnosis not present

## 2017-08-13 DIAGNOSIS — J101 Influenza due to other identified influenza virus with other respiratory manifestations: Secondary | ICD-10-CM | POA: Diagnosis not present

## 2017-08-13 DIAGNOSIS — R509 Fever, unspecified: Secondary | ICD-10-CM | POA: Diagnosis not present

## 2017-09-08 DIAGNOSIS — Z3009 Encounter for other general counseling and advice on contraception: Secondary | ICD-10-CM | POA: Diagnosis not present

## 2017-09-08 DIAGNOSIS — F321 Major depressive disorder, single episode, moderate: Secondary | ICD-10-CM | POA: Diagnosis not present

## 2017-09-27 DIAGNOSIS — F419 Anxiety disorder, unspecified: Secondary | ICD-10-CM | POA: Diagnosis not present

## 2017-09-27 DIAGNOSIS — E559 Vitamin D deficiency, unspecified: Secondary | ICD-10-CM | POA: Diagnosis not present

## 2017-10-06 DIAGNOSIS — F321 Major depressive disorder, single episode, moderate: Secondary | ICD-10-CM | POA: Diagnosis not present

## 2017-10-27 DIAGNOSIS — R52 Pain, unspecified: Secondary | ICD-10-CM | POA: Diagnosis not present

## 2017-11-03 DIAGNOSIS — F321 Major depressive disorder, single episode, moderate: Secondary | ICD-10-CM | POA: Diagnosis not present

## 2017-11-30 DIAGNOSIS — Z3009 Encounter for other general counseling and advice on contraception: Secondary | ICD-10-CM | POA: Diagnosis not present

## 2017-12-01 DIAGNOSIS — F321 Major depressive disorder, single episode, moderate: Secondary | ICD-10-CM | POA: Diagnosis not present

## 2017-12-19 DIAGNOSIS — Z9889 Other specified postprocedural states: Secondary | ICD-10-CM | POA: Diagnosis not present

## 2017-12-19 DIAGNOSIS — H53423 Scotoma of blind spot area, bilateral: Secondary | ICD-10-CM | POA: Diagnosis not present

## 2017-12-19 DIAGNOSIS — H40013 Open angle with borderline findings, low risk, bilateral: Secondary | ICD-10-CM | POA: Diagnosis not present

## 2017-12-19 DIAGNOSIS — H04123 Dry eye syndrome of bilateral lacrimal glands: Secondary | ICD-10-CM | POA: Diagnosis not present

## 2018-01-03 DIAGNOSIS — M9903 Segmental and somatic dysfunction of lumbar region: Secondary | ICD-10-CM | POA: Diagnosis not present

## 2018-01-03 DIAGNOSIS — M9902 Segmental and somatic dysfunction of thoracic region: Secondary | ICD-10-CM | POA: Diagnosis not present

## 2018-01-03 DIAGNOSIS — M5386 Other specified dorsopathies, lumbar region: Secondary | ICD-10-CM | POA: Diagnosis not present

## 2018-01-03 DIAGNOSIS — M9905 Segmental and somatic dysfunction of pelvic region: Secondary | ICD-10-CM | POA: Diagnosis not present

## 2018-01-06 DIAGNOSIS — M9903 Segmental and somatic dysfunction of lumbar region: Secondary | ICD-10-CM | POA: Diagnosis not present

## 2018-01-06 DIAGNOSIS — M9905 Segmental and somatic dysfunction of pelvic region: Secondary | ICD-10-CM | POA: Diagnosis not present

## 2018-01-06 DIAGNOSIS — M9902 Segmental and somatic dysfunction of thoracic region: Secondary | ICD-10-CM | POA: Diagnosis not present

## 2018-01-06 DIAGNOSIS — M5386 Other specified dorsopathies, lumbar region: Secondary | ICD-10-CM | POA: Diagnosis not present

## 2018-01-12 DIAGNOSIS — F321 Major depressive disorder, single episode, moderate: Secondary | ICD-10-CM | POA: Diagnosis not present

## 2018-01-13 DIAGNOSIS — M9903 Segmental and somatic dysfunction of lumbar region: Secondary | ICD-10-CM | POA: Diagnosis not present

## 2018-01-13 DIAGNOSIS — M9905 Segmental and somatic dysfunction of pelvic region: Secondary | ICD-10-CM | POA: Diagnosis not present

## 2018-01-13 DIAGNOSIS — M9902 Segmental and somatic dysfunction of thoracic region: Secondary | ICD-10-CM | POA: Diagnosis not present

## 2018-01-13 DIAGNOSIS — M5386 Other specified dorsopathies, lumbar region: Secondary | ICD-10-CM | POA: Diagnosis not present

## 2018-01-17 DIAGNOSIS — M5386 Other specified dorsopathies, lumbar region: Secondary | ICD-10-CM | POA: Diagnosis not present

## 2018-01-17 DIAGNOSIS — M9903 Segmental and somatic dysfunction of lumbar region: Secondary | ICD-10-CM | POA: Diagnosis not present

## 2018-01-17 DIAGNOSIS — M9905 Segmental and somatic dysfunction of pelvic region: Secondary | ICD-10-CM | POA: Diagnosis not present

## 2018-01-17 DIAGNOSIS — M9902 Segmental and somatic dysfunction of thoracic region: Secondary | ICD-10-CM | POA: Diagnosis not present

## 2018-01-19 DIAGNOSIS — M5386 Other specified dorsopathies, lumbar region: Secondary | ICD-10-CM | POA: Diagnosis not present

## 2018-01-19 DIAGNOSIS — M9903 Segmental and somatic dysfunction of lumbar region: Secondary | ICD-10-CM | POA: Diagnosis not present

## 2018-01-19 DIAGNOSIS — M9902 Segmental and somatic dysfunction of thoracic region: Secondary | ICD-10-CM | POA: Diagnosis not present

## 2018-01-19 DIAGNOSIS — M9905 Segmental and somatic dysfunction of pelvic region: Secondary | ICD-10-CM | POA: Diagnosis not present

## 2018-02-08 DIAGNOSIS — N840 Polyp of corpus uteri: Secondary | ICD-10-CM | POA: Diagnosis not present

## 2018-02-08 DIAGNOSIS — Z09 Encounter for follow-up examination after completed treatment for conditions other than malignant neoplasm: Secondary | ICD-10-CM | POA: Diagnosis not present

## 2018-02-09 DIAGNOSIS — F321 Major depressive disorder, single episode, moderate: Secondary | ICD-10-CM | POA: Diagnosis not present

## 2018-02-20 DIAGNOSIS — Z3009 Encounter for other general counseling and advice on contraception: Secondary | ICD-10-CM | POA: Diagnosis not present

## 2018-03-09 DIAGNOSIS — F321 Major depressive disorder, single episode, moderate: Secondary | ICD-10-CM | POA: Diagnosis not present

## 2018-03-22 DIAGNOSIS — E559 Vitamin D deficiency, unspecified: Secondary | ICD-10-CM | POA: Diagnosis not present

## 2018-03-22 DIAGNOSIS — F419 Anxiety disorder, unspecified: Secondary | ICD-10-CM | POA: Diagnosis not present

## 2018-03-22 DIAGNOSIS — Z23 Encounter for immunization: Secondary | ICD-10-CM | POA: Diagnosis not present

## 2018-03-22 DIAGNOSIS — I10 Essential (primary) hypertension: Secondary | ICD-10-CM | POA: Diagnosis not present

## 2018-03-22 DIAGNOSIS — Z0000 Encounter for general adult medical examination without abnormal findings: Secondary | ICD-10-CM | POA: Diagnosis not present

## 2018-04-06 DIAGNOSIS — F321 Major depressive disorder, single episode, moderate: Secondary | ICD-10-CM | POA: Diagnosis not present

## 2018-05-04 DIAGNOSIS — F321 Major depressive disorder, single episode, moderate: Secondary | ICD-10-CM | POA: Diagnosis not present

## 2018-05-18 DIAGNOSIS — Z3009 Encounter for other general counseling and advice on contraception: Secondary | ICD-10-CM | POA: Diagnosis not present

## 2018-06-08 DIAGNOSIS — F321 Major depressive disorder, single episode, moderate: Secondary | ICD-10-CM | POA: Diagnosis not present

## 2018-06-16 DIAGNOSIS — Z01419 Encounter for gynecological examination (general) (routine) without abnormal findings: Secondary | ICD-10-CM | POA: Diagnosis not present

## 2018-06-16 DIAGNOSIS — Z3040 Encounter for surveillance of contraceptives, unspecified: Secondary | ICD-10-CM | POA: Diagnosis not present

## 2018-06-16 DIAGNOSIS — Z6825 Body mass index (BMI) 25.0-25.9, adult: Secondary | ICD-10-CM | POA: Diagnosis not present

## 2018-06-16 DIAGNOSIS — Z1231 Encounter for screening mammogram for malignant neoplasm of breast: Secondary | ICD-10-CM | POA: Diagnosis not present

## 2018-07-12 IMAGING — CR DG CHEST 2V
2 series · 2 of 2 positions shown · non-contrast
Comparison: None.

CLINICAL DATA: Left side anterior chest pain for 2 weeks.

EXAM:
CHEST  2 VIEW

[w chest pa]
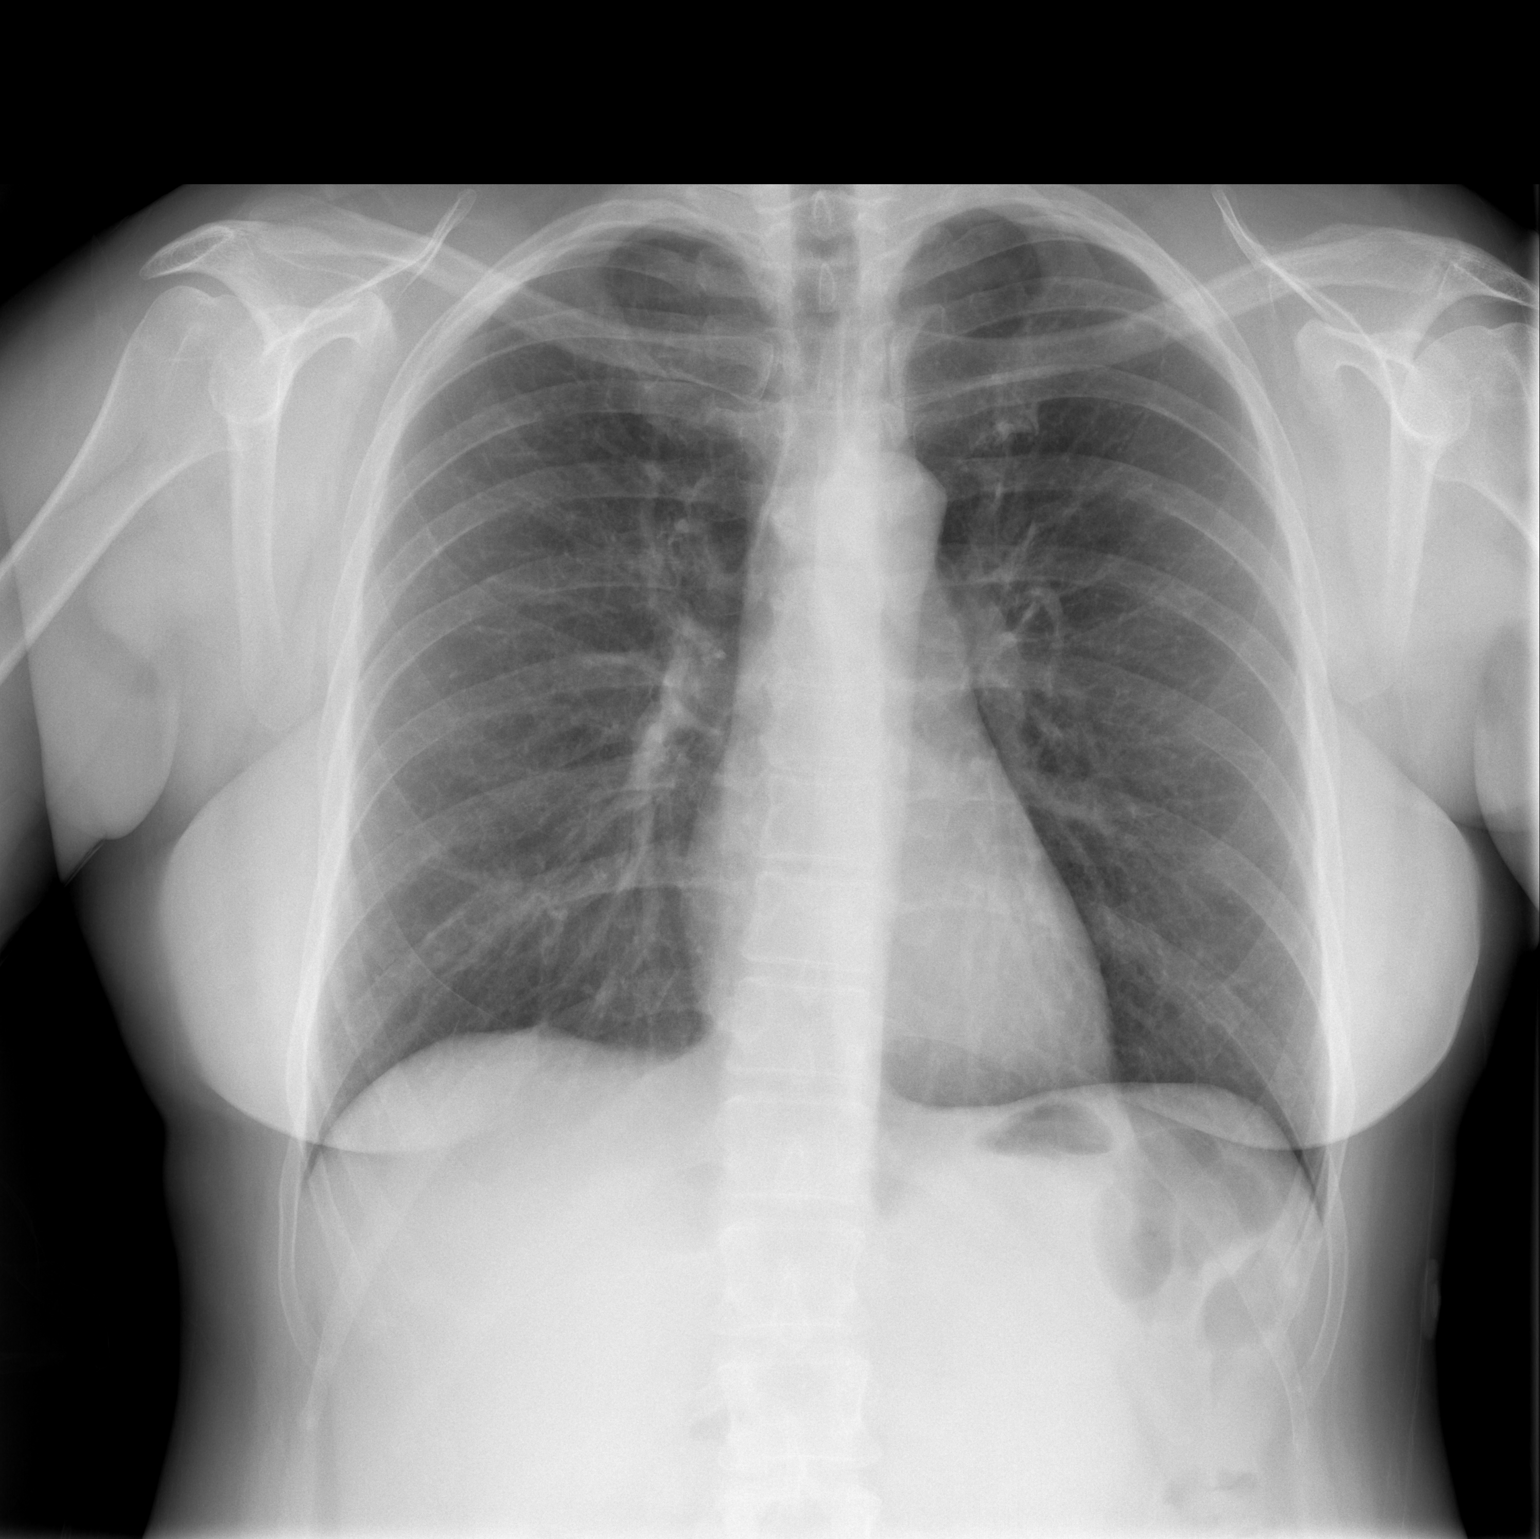

[w chest lat]
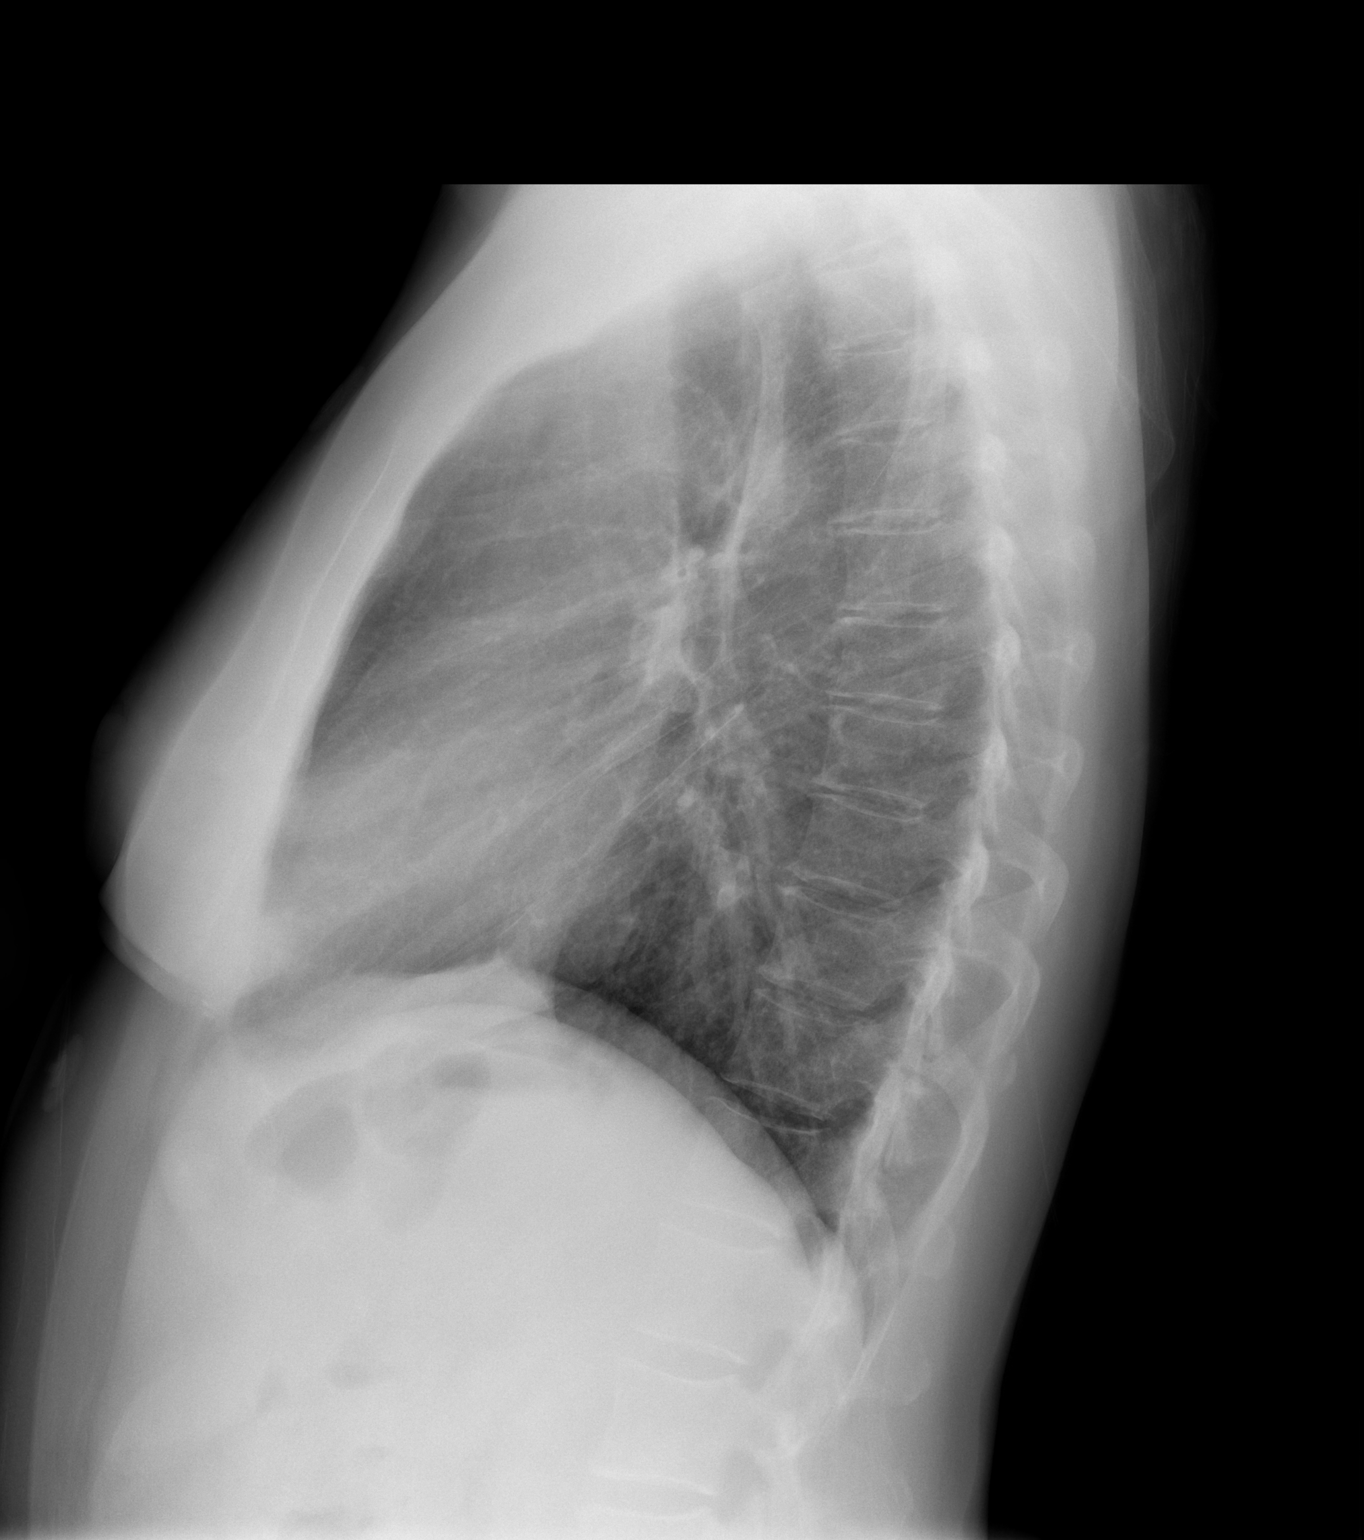

[2 of 2 positions shown; findings below may reference images not displayed]

FINDINGS: The lungs are clear wiithout focal pneumonia, edema, pneumothorax or
pleural effusion. Nodular density/densities projecting over the
lungs are compatible with pads for telemetry leads. The
cardiopericardial silhouette is within normal limits for size. The
visualized bony structures of the thorax are intact.
IMPRESSION: No active cardiopulmonary disease.

## 2018-08-10 DIAGNOSIS — Z3009 Encounter for other general counseling and advice on contraception: Secondary | ICD-10-CM | POA: Diagnosis not present

## 2018-08-17 DIAGNOSIS — F321 Major depressive disorder, single episode, moderate: Secondary | ICD-10-CM | POA: Diagnosis not present

## 2018-09-07 DIAGNOSIS — F321 Major depressive disorder, single episode, moderate: Secondary | ICD-10-CM | POA: Diagnosis not present

## 2018-10-05 DIAGNOSIS — F321 Major depressive disorder, single episode, moderate: Secondary | ICD-10-CM | POA: Diagnosis not present

## 2018-10-24 DIAGNOSIS — F321 Major depressive disorder, single episode, moderate: Secondary | ICD-10-CM | POA: Diagnosis not present

## 2018-11-01 DIAGNOSIS — Z3009 Encounter for other general counseling and advice on contraception: Secondary | ICD-10-CM | POA: Diagnosis not present

## 2018-11-02 DIAGNOSIS — F321 Major depressive disorder, single episode, moderate: Secondary | ICD-10-CM | POA: Diagnosis not present

## 2018-11-30 DIAGNOSIS — F321 Major depressive disorder, single episode, moderate: Secondary | ICD-10-CM | POA: Diagnosis not present

## 2018-12-08 ENCOUNTER — Other Ambulatory Visit: Payer: Self-pay | Admitting: Internal Medicine

## 2018-12-08 DIAGNOSIS — Z20822 Contact with and (suspected) exposure to covid-19: Secondary | ICD-10-CM

## 2018-12-13 LAB — NOVEL CORONAVIRUS, NAA: SARS-CoV-2, NAA: NOT DETECTED

## 2019-01-05 DIAGNOSIS — F321 Major depressive disorder, single episode, moderate: Secondary | ICD-10-CM | POA: Diagnosis not present

## 2019-01-22 DIAGNOSIS — Z3009 Encounter for other general counseling and advice on contraception: Secondary | ICD-10-CM | POA: Diagnosis not present

## 2019-02-01 DIAGNOSIS — F321 Major depressive disorder, single episode, moderate: Secondary | ICD-10-CM | POA: Diagnosis not present

## 2019-02-09 DIAGNOSIS — I10 Essential (primary) hypertension: Secondary | ICD-10-CM | POA: Diagnosis not present

## 2019-02-09 DIAGNOSIS — F419 Anxiety disorder, unspecified: Secondary | ICD-10-CM | POA: Diagnosis not present

## 2019-02-23 DIAGNOSIS — F321 Major depressive disorder, single episode, moderate: Secondary | ICD-10-CM | POA: Diagnosis not present

## 2019-03-28 DIAGNOSIS — F419 Anxiety disorder, unspecified: Secondary | ICD-10-CM | POA: Diagnosis not present

## 2019-03-28 DIAGNOSIS — E559 Vitamin D deficiency, unspecified: Secondary | ICD-10-CM | POA: Diagnosis not present

## 2019-03-28 DIAGNOSIS — Z0000 Encounter for general adult medical examination without abnormal findings: Secondary | ICD-10-CM | POA: Diagnosis not present

## 2019-03-28 DIAGNOSIS — I10 Essential (primary) hypertension: Secondary | ICD-10-CM | POA: Diagnosis not present

## 2019-03-28 DIAGNOSIS — F410 Panic disorder [episodic paroxysmal anxiety] without agoraphobia: Secondary | ICD-10-CM | POA: Diagnosis not present

## 2019-03-28 DIAGNOSIS — Z23 Encounter for immunization: Secondary | ICD-10-CM | POA: Diagnosis not present

## 2019-04-04 DIAGNOSIS — I6529 Occlusion and stenosis of unspecified carotid artery: Secondary | ICD-10-CM | POA: Diagnosis not present

## 2019-04-05 DIAGNOSIS — F321 Major depressive disorder, single episode, moderate: Secondary | ICD-10-CM | POA: Diagnosis not present

## 2019-04-17 DIAGNOSIS — R0681 Apnea, not elsewhere classified: Secondary | ICD-10-CM | POA: Diagnosis not present

## 2019-04-18 DIAGNOSIS — Z3009 Encounter for other general counseling and advice on contraception: Secondary | ICD-10-CM | POA: Diagnosis not present

## 2019-05-03 DIAGNOSIS — F321 Major depressive disorder, single episode, moderate: Secondary | ICD-10-CM | POA: Diagnosis not present

## 2019-05-09 DIAGNOSIS — G4733 Obstructive sleep apnea (adult) (pediatric): Secondary | ICD-10-CM | POA: Diagnosis not present

## 2019-05-22 DIAGNOSIS — G4733 Obstructive sleep apnea (adult) (pediatric): Secondary | ICD-10-CM | POA: Diagnosis not present

## 2019-06-07 DIAGNOSIS — F321 Major depressive disorder, single episode, moderate: Secondary | ICD-10-CM | POA: Diagnosis not present

## 2019-06-19 ENCOUNTER — Other Ambulatory Visit: Payer: Self-pay

## 2019-06-19 ENCOUNTER — Ambulatory Visit: Payer: BC Managed Care – PPO | Admitting: Vascular Surgery

## 2019-06-19 ENCOUNTER — Ambulatory Visit (HOSPITAL_COMMUNITY)
Admission: RE | Admit: 2019-06-19 | Discharge: 2019-06-19 | Disposition: A | Discharge status: Home / Self Care | Payer: BC Managed Care – PPO | Source: Ambulatory Visit | Attending: Surgery | Admitting: Surgery

## 2019-06-19 ENCOUNTER — Encounter: Payer: Self-pay | Admitting: Vascular Surgery

## 2019-06-19 DIAGNOSIS — I6529 Occlusion and stenosis of unspecified carotid artery: Secondary | ICD-10-CM | POA: Insufficient documentation

## 2019-06-19 DIAGNOSIS — I6523 Occlusion and stenosis of bilateral carotid arteries: Secondary | ICD-10-CM | POA: Diagnosis not present

## 2019-06-19 NOTE — Progress Notes (Signed)
Patient name: Robin Bruce MRN: [id number] DOB: June 08, 1970 Sex: female  REASON FOR CONSULT: Evaluate for carotid disease/stenosis  HPI: Robin Bruce is a 49 y.o. female, with history migraines that presents for evaluation of carotid disease.  Patient was apparently getting some dental x-rays with the dentist noted calcification of her carotid arteries.  She then went to her primary care doctor and referral was made to vascular surgery just to ensure there was no underlying issues and for further evaluation.  She states she has had some trouble with vision particularly in both eyes and things become more blurry while watching large screen TV.  There have been no focal vision loss, there has been no focal weakness or numbness in arm or leg.  She denies any history of head or neck surgery previously.  She did smoke but she has since quit.  Past Medical History:  Diagnosis Date  . Abnormal Pap smear   . Brain tumor (benign) ([place])   . Endometriosis   . H/O varicella   . Headache(784.0)    migraines  . History of kidney stones   . HSV-2 infection   . 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 APPENDECTOMY    . BRAIN SURGERY    . LAPAROTOMY    . WISDOM TOOTH EXTRACTION      Family History  Problem Relation Age of Onset  . Depression Mother   . Heart disease Father   . Diabetes Maternal Grandfather     SOCIAL HISTORY: Social History   Socioeconomic History  . Marital status: Divorced    Spouse name: Not on file  . Number of children: Not on file  . Years of education: Not on file  . Highest education level: Not on file  Occupational History  . Not on file  Tobacco Use  . Smoking status: Former Smoker    Quit date: 09/22/2015    Years since quitting: 3.7  . Smokeless tobacco: Never Used  Substance and Sexual Activity  . Alcohol use: Yes    Alcohol/week: 11.0 standard drinks    Types: 7 Standard  drinks or equivalent, 4 Glasses of wine per week    Comment: socially   . Drug use: Yes  . Sexual activity: Yes    Partners: Male    Birth control/protection: Injection  Other Topics Concern  . Not on file  Social History Narrative  . Not on file   Social Determinants of Health   Financial Resource Strain:   . Difficulty of Paying Living Expenses: Not on file  Food Insecurity:   . Worried About Charity fundraiser in the Last Year: Not on file  . Ran Out of Food in the Last Year: Not on file  Transportation Needs:   . Lack of Transportation (Medical): Not on file  . Lack of Transportation (Non-Medical): Not on file  Physical Activity:   . Days of Exercise per Week: Not on file  . Minutes of Exercise per Session: Not on file  Stress:   . Feeling of Stress : Not on file  Social Connections:   . Frequency of Communication with Friends and Family: Not on file  . Frequency of Social Gatherings with Friends and Family: Not on file  . Attends Religious Services: Not on file  . Active Member of Clubs or Organizations: Not on file  . Attends Archivist Meetings: Not on file  .  Marital Status: Not on file  Intimate Partner Violence:   . Fear of Current or Ex-Partner: Not on file  . Emotionally Abused: Not on file  . Physically Abused: Not on file  . Sexually Abused: Not on file    Allergies  Allergen Reactions  . Codeine Other (See Comments)    Cramps    Current Outpatient Medications  Medication Sig Dispense Refill  . buPROPion (WELLBUTRIN SR) 100 MG 12 hr tablet bupropion HCl SR 100 mg tablet,12 hr sustained-release    . citalopram (CELEXA) 10 MG tablet Take 1 tablet (10 mg total) by mouth daily. 30 tablet 1  . lisinopril (ZESTRIL) 20 MG tablet Take 10-20 mg by mouth daily.    . medroxyPROGESTERone (DEPO-PROVERA) 150 MG/ML injection Inject 150 mg into the muscle every 3 (three) months.    Marland Kitchen atenolol (TENORMIN) 25 MG tablet Take 1 tablet (25 mg total) by mouth  daily. (Patient not taking: Reported on 06/19/2019) 30 tablet 0  . LORazepam (ATIVAN) 0.5 MG tablet Take 1 tablet (0.5 mg total) by mouth 2 (two) times daily as needed for anxiety. (Patient not taking: Reported on 06/19/2019) 30 tablet 0   No current facility-administered medications for this visit.    REVIEW OF SYSTEMS:  [X]  denotes positive finding, [ ]  denotes negative finding Cardiac  Comments:  Chest pain or chest pressure:    Shortness of breath upon exertion:    Short of breath when lying flat:    Irregular heart rhythm:        Vascular    Pain in calf, thigh, or hip brought on by ambulation:    Pain in feet at night that wakes you up from your sleep:     Blood clot in your veins:    Leg swelling:         Pulmonary    Oxygen at home:    Productive cough:     Wheezing:         Neurologic    Sudden weakness in arms or legs:     Sudden numbness in arms or legs:     Sudden onset of difficulty speaking or slurred speech:    Temporary loss of vision in one eye:     Problems with dizziness:         Gastrointestinal    Blood in stool:     Vomited blood:         Genitourinary    Burning when urinating:     Blood in urine:        Psychiatric    Major depression:         Hematologic    Bleeding problems:    Problems with blood clotting too easily:        Skin    Rashes or ulcers:        Constitutional    Fever or chills:      PHYSICAL EXAM: Vitals:   06/19/19 1031 06/19/19 1036  BP: 120/81 125/82  Pulse: 88 92  Resp: 14   Temp: 97.8 F (36.6 C)   TempSrc: Temporal   SpO2: 97%   Weight: 163 lb (73.9 kg)   Height: 5\' 5"  (1.651 m)     GENERAL: The patient is a well-nourished female, in no acute distress. The vital signs are documented above. CARDIAC: There is a regular rate and rhythm.  VASCULAR:  2+ palpable radial pulse bilateral 2+ palpable femoral pulse bilateral 2+ palpable dorsalis pedis pulse bilateral PULMONARY: There  is good air exchange  bilaterally without wheezing or rales. ABDOMEN: Soft and non-tender with normal pitched bowel sounds.  MUSCULOSKELETAL: There are no major deformities or cyanosis. NEUROLOGIC: No focal weakness or paresthesias are detected. SKIN: There are no ulcers or rashes noted. PSYCHIATRIC: The patient has a normal affect.  DATA:   I independently reviewed her carotid duplex and she really has minimal disease in the common, external, and internal carotid arteries bilaterally.  The velocities in the left ICA would estimate 40 to 59% stenosis (on the low end of this range) but on B-mode imaging I do not see any significant disease.  Assessment/Plan:  49 year old female presents for evaluation of carotid disease after calcification was noted on her dental x-rays.  I independently reviewed her carotid duplex and I do not see any significant stenosis or plaque visualized in the ICA, ECA, or CCA bilaterally.  She is completely asymptomatic from a carotid standpoint.  I offered her reassurance and she certainly does not need any carotid intervention.  We decided we would follow-up again in 2 years with a carotid duplex just to ensure there has been no significant progression.     Marty Heck, MD [facility] Office: [phone]

## 2019-06-20 ENCOUNTER — Other Ambulatory Visit: Payer: Self-pay | Admitting: *Deleted

## 2019-06-20 DIAGNOSIS — I6523 Occlusion and stenosis of bilateral carotid arteries: Secondary | ICD-10-CM

## 2019-06-22 DIAGNOSIS — G4733 Obstructive sleep apnea (adult) (pediatric): Secondary | ICD-10-CM | POA: Diagnosis not present

## 2019-07-04 DIAGNOSIS — F321 Major depressive disorder, single episode, moderate: Secondary | ICD-10-CM | POA: Diagnosis not present

## 2019-07-19 DIAGNOSIS — Z3009 Encounter for other general counseling and advice on contraception: Secondary | ICD-10-CM | POA: Diagnosis not present

## 2019-07-22 DIAGNOSIS — G4733 Obstructive sleep apnea (adult) (pediatric): Secondary | ICD-10-CM | POA: Diagnosis not present

## 2019-08-03 DIAGNOSIS — F321 Major depressive disorder, single episode, moderate: Secondary | ICD-10-CM | POA: Diagnosis not present

## 2019-08-20 DIAGNOSIS — G4733 Obstructive sleep apnea (adult) (pediatric): Secondary | ICD-10-CM | POA: Diagnosis not present

## 2019-08-28 DIAGNOSIS — F321 Major depressive disorder, single episode, moderate: Secondary | ICD-10-CM | POA: Diagnosis not present

## 2019-09-03 DIAGNOSIS — Z3040 Encounter for surveillance of contraceptives, unspecified: Secondary | ICD-10-CM | POA: Diagnosis not present

## 2019-09-03 DIAGNOSIS — Z1239 Encounter for other screening for malignant neoplasm of breast: Secondary | ICD-10-CM | POA: Diagnosis not present

## 2019-09-03 DIAGNOSIS — Z1231 Encounter for screening mammogram for malignant neoplasm of breast: Secondary | ICD-10-CM | POA: Diagnosis not present

## 2019-09-03 DIAGNOSIS — Z01419 Encounter for gynecological examination (general) (routine) without abnormal findings: Secondary | ICD-10-CM | POA: Diagnosis not present

## 2019-09-03 DIAGNOSIS — A609 Anogenital herpesviral infection, unspecified: Secondary | ICD-10-CM | POA: Diagnosis not present

## 2019-09-20 DIAGNOSIS — G4733 Obstructive sleep apnea (adult) (pediatric): Secondary | ICD-10-CM | POA: Diagnosis not present

## 2019-10-02 DIAGNOSIS — F321 Major depressive disorder, single episode, moderate: Secondary | ICD-10-CM | POA: Diagnosis not present

## 2019-10-05 DIAGNOSIS — Z3009 Encounter for other general counseling and advice on contraception: Secondary | ICD-10-CM | POA: Diagnosis not present

## 2019-10-20 DIAGNOSIS — G4733 Obstructive sleep apnea (adult) (pediatric): Secondary | ICD-10-CM | POA: Diagnosis not present

## 2019-10-30 DIAGNOSIS — F321 Major depressive disorder, single episode, moderate: Secondary | ICD-10-CM | POA: Diagnosis not present

## 2019-11-20 DIAGNOSIS — G4733 Obstructive sleep apnea (adult) (pediatric): Secondary | ICD-10-CM | POA: Diagnosis not present

## 2019-12-04 DIAGNOSIS — F321 Major depressive disorder, single episode, moderate: Secondary | ICD-10-CM | POA: Diagnosis not present

## 2019-12-20 DIAGNOSIS — G4733 Obstructive sleep apnea (adult) (pediatric): Secondary | ICD-10-CM | POA: Diagnosis not present

## 2019-12-27 DIAGNOSIS — Z3042 Encounter for surveillance of injectable contraceptive: Secondary | ICD-10-CM | POA: Diagnosis not present

## 2020-01-01 DIAGNOSIS — F321 Major depressive disorder, single episode, moderate: Secondary | ICD-10-CM | POA: Diagnosis not present

## 2020-01-20 DIAGNOSIS — G4733 Obstructive sleep apnea (adult) (pediatric): Secondary | ICD-10-CM | POA: Diagnosis not present

## 2020-02-05 DIAGNOSIS — F321 Major depressive disorder, single episode, moderate: Secondary | ICD-10-CM | POA: Diagnosis not present

## 2020-02-28 DIAGNOSIS — F321 Major depressive disorder, single episode, moderate: Secondary | ICD-10-CM | POA: Diagnosis not present

## 2020-03-27 DIAGNOSIS — Z3042 Encounter for surveillance of injectable contraceptive: Secondary | ICD-10-CM | POA: Diagnosis not present

## 2020-04-02 DIAGNOSIS — F321 Major depressive disorder, single episode, moderate: Secondary | ICD-10-CM | POA: Diagnosis not present

## 2020-04-15 DIAGNOSIS — I10 Essential (primary) hypertension: Secondary | ICD-10-CM | POA: Diagnosis not present

## 2020-04-15 DIAGNOSIS — Z23 Encounter for immunization: Secondary | ICD-10-CM | POA: Diagnosis not present

## 2020-04-15 DIAGNOSIS — Z0000 Encounter for general adult medical examination without abnormal findings: Secondary | ICD-10-CM | POA: Diagnosis not present

## 2020-04-15 DIAGNOSIS — E559 Vitamin D deficiency, unspecified: Secondary | ICD-10-CM | POA: Diagnosis not present

## 2020-05-06 DIAGNOSIS — F321 Major depressive disorder, single episode, moderate: Secondary | ICD-10-CM | POA: Diagnosis not present

## 2020-05-29 ENCOUNTER — Encounter: Payer: Self-pay | Admitting: Podiatry

## 2020-05-29 ENCOUNTER — Ambulatory Visit: Payer: BC Managed Care – PPO | Admitting: Podiatry

## 2020-05-29 ENCOUNTER — Ambulatory Visit (INDEPENDENT_AMBULATORY_CARE_PROVIDER_SITE_OTHER): Payer: BC Managed Care – PPO

## 2020-05-29 ENCOUNTER — Other Ambulatory Visit: Payer: Self-pay

## 2020-05-29 DIAGNOSIS — M79671 Pain in right foot: Secondary | ICD-10-CM

## 2020-05-29 DIAGNOSIS — M722 Plantar fascial fibromatosis: Secondary | ICD-10-CM | POA: Diagnosis not present

## 2020-05-29 MED ORDER — TRIAMCINOLONE ACETONIDE 10 MG/ML IJ SUSP
10.0000 mg | Freq: Once | INTRAMUSCULAR | Status: AC
  Administered 2020-05-29: 10 mg

## 2020-05-29 NOTE — Patient Instructions (Signed)

## 2020-05-29 NOTE — Progress Notes (Signed)
Subjective:   Patient ID: Robin Bruce, female   DOB: 50 y.o.   MRN: [id number]   HPI Patient presents with exquisite discomfort in the plantar heel region right stating she does not remember injury with a history of this a long time ago.  States she has had long-term history of this and most the time the pain is not significant but at times it can flareup.  Patient does not smoke likes to be active   Review of Systems  All other systems reviewed and are negative.       Objective:  Physical Exam Vitals and nursing note reviewed.  Constitutional:      Appearance: She is well-developed and well-nourished.  Cardiovascular:     Pulses: Intact distal pulses.  Pulmonary:     Effort: Pulmonary effort is normal.  Musculoskeletal:        General: Normal range of motion.  Skin:    General: Skin is warm.  Neurological:     Mental Status: She is alert.     Neurovascular status intact muscle strength was found to be adequate range of motion within normal limits.  Patient is found to have exquisite discomfort in the plantar heel region right with inflammation fluid in the medial band a slightly diminished heel structure right and good digital perfusion well oriented x3 with moderate depression of the arch     Assessment:  Acute plantar fasciitis right with inflammation with long-term history of fasciitis condition     Plan:  H&P discussed chronic nature of condition and long-term recommended orthotics which we will discuss at next visit.  Today I went ahead on focusing on the acute inflammation right and I did sterile prep injected the fascia 3 mg Kenalog 5 mg Xylocaine applied fascial brace to lift up the arch and gave instructions on physical therapy anti-inflammatory therapy.  Reappoint to recheck  X-rays indicate spur formation that actually is more enlarged on the left over the right heel with moderate depression of the arch

## 2020-06-03 DIAGNOSIS — F321 Major depressive disorder, single episode, moderate: Secondary | ICD-10-CM | POA: Diagnosis not present

## 2020-06-06 ENCOUNTER — Other Ambulatory Visit: Payer: Self-pay | Admitting: Podiatry

## 2020-06-06 DIAGNOSIS — M722 Plantar fascial fibromatosis: Secondary | ICD-10-CM

## 2020-06-11 DIAGNOSIS — Z20822 Contact with and (suspected) exposure to covid-19: Secondary | ICD-10-CM | POA: Diagnosis not present

## 2020-06-12 ENCOUNTER — Ambulatory Visit: Payer: BC Managed Care – PPO | Admitting: Podiatry

## 2020-06-16 DIAGNOSIS — U071 COVID-19: Secondary | ICD-10-CM | POA: Diagnosis not present

## 2020-06-21 DIAGNOSIS — Z20822 Contact with and (suspected) exposure to covid-19: Secondary | ICD-10-CM | POA: Diagnosis not present

## 2020-07-01 DIAGNOSIS — F321 Major depressive disorder, single episode, moderate: Secondary | ICD-10-CM | POA: Diagnosis not present

## 2020-07-31 DIAGNOSIS — F321 Major depressive disorder, single episode, moderate: Secondary | ICD-10-CM | POA: Diagnosis not present

## 2020-09-04 DIAGNOSIS — F321 Major depressive disorder, single episode, moderate: Secondary | ICD-10-CM | POA: Diagnosis not present

## 2020-09-24 DIAGNOSIS — F321 Major depressive disorder, single episode, moderate: Secondary | ICD-10-CM | POA: Diagnosis not present

## 2020-09-27 DIAGNOSIS — Z20822 Contact with and (suspected) exposure to covid-19: Secondary | ICD-10-CM | POA: Diagnosis not present

## 2020-09-29 DIAGNOSIS — J019 Acute sinusitis, unspecified: Secondary | ICD-10-CM | POA: Diagnosis not present

## 2020-10-14 DIAGNOSIS — T63441A Toxic effect of venom of bees, accidental (unintentional), initial encounter: Secondary | ICD-10-CM | POA: Diagnosis not present

## 2020-12-11 ENCOUNTER — Other Ambulatory Visit: Payer: Self-pay

## 2020-12-11 ENCOUNTER — Ambulatory Visit (INDEPENDENT_AMBULATORY_CARE_PROVIDER_SITE_OTHER): Payer: BC Managed Care – PPO

## 2020-12-11 ENCOUNTER — Ambulatory Visit: Payer: BC Managed Care – PPO | Admitting: Podiatry

## 2020-12-11 DIAGNOSIS — M722 Plantar fascial fibromatosis: Secondary | ICD-10-CM

## 2020-12-11 MED ORDER — TRIAMCINOLONE ACETONIDE 10 MG/ML IJ SUSP
10.0000 mg | Freq: Once | INTRAMUSCULAR | Status: AC
  Administered 2020-12-11: 10 mg

## 2020-12-11 MED ORDER — MELOXICAM 15 MG PO TABS: 15.0000 mg | ORAL_TABLET | Freq: Every day | ORAL | 2 refills | Status: AC

## 2020-12-11 NOTE — Progress Notes (Signed)
Subjective:   Patient ID: Robin Bruce, female   DOB: 50 y.o.   MRN: [id number]   HPI Patient presents stating that her right heel has been very intensely sore her left heel hurts some and also she fell down some stairs and traumatized her right foot.  States that its been getting gradually worse and making walking difficult and that she cannot be active due to the pain she is in   ROS      Objective:  Physical Exam  Neurovascular status intact with exquisite discomfort plantar aspect right heel at the insertional point tendon into the calcaneus with inflammation fluid buildup with moderate pain on the left 1 with no indications of pathology.  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  I did go ahead today I did sterile prep injected the plantar fascial right 3 mg Kenalog 5 mg Xylocaine and dispensed night splint with instructions on usage and educated her on ice shoe gear modification.  Will be seen back may require more aggressive treatment if symptoms do not reduce and placed on oral anti-inflammatory today  X-rays dated today indicated there is increased slightly in spur size moderate depression of the arch no indication stress fracture of the heel bone

## 2020-12-30 ENCOUNTER — Telehealth: Payer: Self-pay | Admitting: Podiatry

## 2020-12-30 NOTE — Telephone Encounter (Signed)
Orthotics in.. called pt to schedule an appt and she needs a Monday or Friday appt as she works from home. I told her I would give her a call back when I we had some openings on one of the days needed.

## 2021-03-02 DIAGNOSIS — R220 Localized swelling, mass and lump, head: Secondary | ICD-10-CM | POA: Diagnosis not present

## 2021-03-02 DIAGNOSIS — R221 Localized swelling, mass and lump, neck: Secondary | ICD-10-CM | POA: Diagnosis not present

## 2021-04-02 ENCOUNTER — Ambulatory Visit (INDEPENDENT_AMBULATORY_CARE_PROVIDER_SITE_OTHER): Payer: BC Managed Care – PPO

## 2021-04-02 ENCOUNTER — Ambulatory Visit: Payer: BC Managed Care – PPO | Admitting: Podiatry

## 2021-04-02 ENCOUNTER — Encounter: Payer: Self-pay | Admitting: Podiatry

## 2021-04-02 ENCOUNTER — Other Ambulatory Visit: Payer: Self-pay

## 2021-04-02 DIAGNOSIS — M722 Plantar fascial fibromatosis: Secondary | ICD-10-CM

## 2021-04-02 MED ORDER — TRIAMCINOLONE ACETONIDE 10 MG/ML IJ SUSP
20.0000 mg | Freq: Once | INTRAMUSCULAR | Status: AC
Start: 2021-04-02 — End: 2021-04-02
  Administered 2021-04-02: 20 mg

## 2021-04-02 NOTE — Progress Notes (Signed)
Subjective:   Patient ID: Robin Bruce, female   DOB: 50 y.o.   MRN: [id number]   HPI Patient presents stating that orthotics are not working on her right heel has absolutely been killing her.  States this has been present for around 4 years and has gradually become worse with only short periods of relief with medication and treatment.  Extensive treatment done by me in the past   ROS      Objective:  Physical Exam  Neurovascular status intact with exquisite discomfort plantar aspect heel right over left with patient found to have chronic acute inflammatory condition and it is affecting all facets of her life at this current time      Assessment:  Acute Planter fasciitis that did not respond to numerous conservative treatments     Plan:  H&P reviewed condition and I do think endoscopic release of the medial tendon is good to be best for this.  Patient wants surgery and I explained what would be required and she is willing to accept the risk of procedure and at this point I did review with her the surgery and recovery with endoscopic surgery.  She signed consent form after reviewing alternative treatments complications and is scheduled for outpatient surgery with all questions answered.  She cannot do this for about a month and she is desperate for short-term relief but she is going on vacation so I did do small injections in each heel 3 mg Dexasone Kenalog 5 mg Xylocaine to give her short-term relief of her inflammatory condition.  She understands total recovery can take 6 months I did explain the possibilities for longer-term arch pain or lateral foot pain or dorsal foot pain with this kind of procedure.  Air fracture walker dispensed all instructions on usage given to patient  X-rays dated today did indicate spur more left than right no indication stress fracture or other acute condition

## 2021-04-07 ENCOUNTER — Telehealth: Payer: Self-pay | Admitting: Urology

## 2021-04-07 NOTE — Telephone Encounter (Signed)
DOS - 04/28/21  EPF RIGHT --- [id number]  SPOKE WITH MAC F. WITH [employer] AND HE STATED THAT FOR CPT CODE 71165 NO PRIOR AUTH IS REQUIRED.  REF # [id number]

## 2021-04-27 MED ORDER — HYDROCODONE-ACETAMINOPHEN 10-325 MG PO TABS: 1.0000 | ORAL_TABLET | Freq: Three times a day (TID) | ORAL | 0 refills | Status: AC | PRN

## 2021-04-27 NOTE — Addendum Note (Signed)
Addended by: Wallene Huh on: 04/27/2021 01:14 PM   Modules accepted: Orders

## 2021-04-28 ENCOUNTER — Encounter: Payer: Self-pay | Admitting: Podiatry

## 2021-04-28 DIAGNOSIS — M722 Plantar fascial fibromatosis: Secondary | ICD-10-CM | POA: Diagnosis not present

## 2021-05-04 ENCOUNTER — Other Ambulatory Visit: Payer: Self-pay

## 2021-05-04 ENCOUNTER — Encounter: Payer: Self-pay | Admitting: Podiatry

## 2021-05-04 ENCOUNTER — Ambulatory Visit (INDEPENDENT_AMBULATORY_CARE_PROVIDER_SITE_OTHER): Payer: BC Managed Care – PPO | Admitting: Podiatry

## 2021-05-04 DIAGNOSIS — M722 Plantar fascial fibromatosis: Secondary | ICD-10-CM

## 2021-05-04 NOTE — Progress Notes (Signed)
Subjective:   Patient ID: Robin Bruce, female   DOB: 50 y.o.   MRN: [id number]   HPI Patient states she is doing well with surgery having minimal discomfort and able to walk with her boot neuro   ROS      Objective:  Physical Exam  Vascular status intact negative [name]' sign noted wound edges right medial lateral heel doing well wound edges well coapted stitches in place     Assessment:  Doing well post endoscopic surgery right     Plan:  H&P reviewed condition reapplied sterile dressing continue boot usage dispensed surgical shoe reappoint 2 weeks for suture removal and that should be final visit per patient.  Encouraged her to call with any concerns she may have prior to that this

## 2021-05-13 DIAGNOSIS — Z23 Encounter for immunization: Secondary | ICD-10-CM | POA: Diagnosis not present

## 2021-05-13 DIAGNOSIS — K625 Hemorrhage of anus and rectum: Secondary | ICD-10-CM | POA: Diagnosis not present

## 2021-05-20 ENCOUNTER — Ambulatory Visit (INDEPENDENT_AMBULATORY_CARE_PROVIDER_SITE_OTHER): Payer: BC Managed Care – PPO | Admitting: Podiatry

## 2021-05-20 ENCOUNTER — Other Ambulatory Visit: Payer: Self-pay

## 2021-05-20 ENCOUNTER — Encounter: Payer: Self-pay | Admitting: Podiatry

## 2021-05-20 DIAGNOSIS — M722 Plantar fascial fibromatosis: Secondary | ICD-10-CM | POA: Diagnosis not present

## 2021-05-20 NOTE — Progress Notes (Signed)
Subjective:   Patient ID: Robin Bruce, female   DOB: 50 y.o.   MRN: [id number]   HPI Patient presents stating she is doing very well here for stitch removal starting to wear shoes   ROS      Objective:  Physical Exam  Neurovascular status intact negative Robin Bruce' sign was noted with incision sites intact stitches intact     Assessment:  Doing well post endoscopic surgery right stitches intact     Plan:  Stitches removed and the wound edges are well coapted applied stocking for compression advised on slow return to shoe gear continued elevation usage of boot night splint as needed.  Reappoint as needed

## 2021-05-26 DIAGNOSIS — I10 Essential (primary) hypertension: Secondary | ICD-10-CM | POA: Diagnosis not present

## 2021-05-26 DIAGNOSIS — Z0000 Encounter for general adult medical examination without abnormal findings: Secondary | ICD-10-CM | POA: Diagnosis not present

## 2021-05-26 DIAGNOSIS — E559 Vitamin D deficiency, unspecified: Secondary | ICD-10-CM | POA: Diagnosis not present

## 2021-05-29 DIAGNOSIS — Z1231 Encounter for screening mammogram for malignant neoplasm of breast: Secondary | ICD-10-CM | POA: Diagnosis not present

## 2021-05-30 ENCOUNTER — Other Ambulatory Visit: Payer: Self-pay

## 2021-06-04 ENCOUNTER — Other Ambulatory Visit: Payer: Self-pay

## 2021-06-04 DIAGNOSIS — I6523 Occlusion and stenosis of bilateral carotid arteries: Secondary | ICD-10-CM

## 2021-06-04 NOTE — Progress Notes (Signed)
Error

## 2021-06-16 ENCOUNTER — Ambulatory Visit: Payer: BC Managed Care – PPO | Admitting: Vascular Surgery

## 2021-06-16 ENCOUNTER — Encounter (HOSPITAL_COMMUNITY): Payer: BC Managed Care – PPO

## 2021-06-16 DIAGNOSIS — Z03818 Encounter for observation for suspected exposure to other biological agents ruled out: Secondary | ICD-10-CM | POA: Diagnosis not present

## 2021-06-16 DIAGNOSIS — R221 Localized swelling, mass and lump, neck: Secondary | ICD-10-CM | POA: Diagnosis not present

## 2021-06-16 DIAGNOSIS — J029 Acute pharyngitis, unspecified: Secondary | ICD-10-CM | POA: Diagnosis not present

## 2021-06-19 DIAGNOSIS — R221 Localized swelling, mass and lump, neck: Secondary | ICD-10-CM | POA: Diagnosis not present

## 2021-06-25 DIAGNOSIS — K625 Hemorrhage of anus and rectum: Secondary | ICD-10-CM | POA: Diagnosis not present

## 2021-06-25 DIAGNOSIS — Z1211 Encounter for screening for malignant neoplasm of colon: Secondary | ICD-10-CM | POA: Diagnosis not present

## 2021-06-30 ENCOUNTER — Ambulatory Visit: Payer: BC Managed Care – PPO | Admitting: Vascular Surgery

## 2021-06-30 ENCOUNTER — Other Ambulatory Visit: Payer: Self-pay

## 2021-06-30 ENCOUNTER — Ambulatory Visit (HOSPITAL_COMMUNITY)
Admission: RE | Admit: 2021-06-30 | Discharge: 2021-06-30 | Disposition: A | Discharge status: Home / Self Care | Payer: BC Managed Care – PPO | Source: Ambulatory Visit | Attending: Vascular Surgery | Admitting: Vascular Surgery

## 2021-06-30 ENCOUNTER — Encounter: Payer: Self-pay | Admitting: Vascular Surgery

## 2021-06-30 VITALS — BP 115/77 | HR 86 | Temp 98.1°F | Resp 14 | Ht 66.0 in | Wt 176.0 lb

## 2021-06-30 DIAGNOSIS — I6523 Occlusion and stenosis of bilateral carotid arteries: Secondary | ICD-10-CM | POA: Insufficient documentation

## 2021-06-30 NOTE — Progress Notes (Signed)
Patient name: Robin Bruce MRN: [id number] DOB: 1971/05/20 Sex: female  REASON FOR CONSULT: 2 year follow-up - carotid disease  HPI: Robin Bruce is a 51 y.o. female, with history migraines that presents for 2 year follow-up of her carotid artery disease.  Patient was getting dental x-rays with the dentist and noted to have calcification of her carotid arteries.  She then went to her primary care doctor and referral was made to vascular surgery just to ensure there was no underlying issues and for further evaluation.  Previous carotid duplex from 06/19/2019 showed a 40 to 59% stenosis on the left and on the low end of that range.  Over the last 2 years she reports no history of vision loss, weakness numbness in her arm or leg or other signs or symptoms of stroke.  She did have surgery for Planter fasciitis.  Past Medical History:  Diagnosis Date   Abnormal Pap smear    Brain tumor (benign) (HCC)    Endometriosis    H/O varicella    Headache(784.0)    migraines   History of kidney stones    HSV-2 infection    Uterine perforation by intrauterine contraceptive device     Past Surgical History:  Procedure Laterality Date   APPENDECTOMY     BRAIN SURGERY     LAPAROTOMY     WISDOM TOOTH EXTRACTION      Family History  Problem Relation Age of Onset   Depression Mother    Heart disease Father    Diabetes Maternal Grandfather     SOCIAL HISTORY: Social History   Socioeconomic History   Marital status: Divorced    Spouse name: Not on file   Number of children: Not on file   Years of education: Not on file   Highest education level: Not on file  Occupational History   Not on file  Tobacco Use   Smoking status: Former    Types: Cigarettes    Quit date: 09/22/2015    Years since quitting: 5.7   Smokeless tobacco: Never  Substance and Sexual Activity   Alcohol use: Yes    Alcohol/week: 11.0 standard drinks    Types: 7 Standard drinks or equivalent, 4 Glasses of wine  per week    Comment: socially    Drug use: Yes   Sexual activity: Yes    Partners: Male    Birth control/protection: Injection  Other Topics Concern   Not on file  Social History Narrative   Not on file   Social Determinants of Health   Financial Resource Strain: Not on file  Food Insecurity: Not on file  Transportation Needs: Not on file  Physical Activity: Not on file  Stress: Not on file  Social Connections: Not on file  Intimate Partner Violence: Not on file    Allergies  Allergen Reactions   Codeine Other (See Comments)    Cramps    Current Outpatient Medications  Medication Sig Dispense Refill   ALPRAZolam (XANAX) 0.25 MG tablet alprazolam 0.25 mg tablet  TAKE 1 TABLET BY MOUTH TWICE DAILY AS NEEDED     buPROPion (WELLBUTRIN SR) 100 MG 12 hr tablet bupropion HCl SR 100 mg tablet,12 hr sustained-release     citalopram (CELEXA) 10 MG tablet Take 1 tablet (10 mg total) by mouth daily. 30 tablet 1   lisinopril (ZESTRIL) 20 MG tablet Take 10-20 mg by mouth daily.     valACYclovir (VALTREX) 500 MG tablet Take 500 mg by  mouth daily.     atenolol (TENORMIN) 25 MG tablet Take 1 tablet (25 mg total) by mouth daily. (Patient not taking: Reported on 06/19/2019) 30 tablet 0   LORazepam (ATIVAN) 0.5 MG tablet Take 1 tablet (0.5 mg total) by mouth 2 (two) times daily as needed for anxiety. (Patient not taking: Reported on 06/19/2019) 30 tablet 0   medroxyPROGESTERone (DEPO-PROVERA) 150 MG/ML injection Inject 150 mg into the muscle every 3 (three) months.     meloxicam (MOBIC) 15 MG tablet Take 1 tablet (15 mg total) by mouth daily. 30 tablet 2   No current facility-administered medications for this visit.    REVIEW OF SYSTEMS:  [X]  denotes positive finding, [ ]  denotes negative finding Cardiac  Comments:  Chest pain or chest pressure:    Shortness of breath upon exertion:    Short of breath when lying flat:    Irregular heart rhythm:        Vascular    Pain in calf, thigh,  or hip brought on by ambulation:    Pain in feet at night that wakes you up from your sleep:     Blood clot in your veins:    Leg swelling:         Pulmonary    Oxygen at home:    Productive cough:     Wheezing:         Neurologic    Sudden weakness in arms or legs:     Sudden numbness in arms or legs:     Sudden onset of difficulty speaking or slurred speech:    Temporary loss of vision in one eye:     Problems with dizziness:         Gastrointestinal    Blood in stool:     Vomited blood:         Genitourinary    Burning when urinating:     Blood in urine:        Psychiatric    Major depression:         Hematologic    Bleeding problems:    Problems with blood clotting too easily:        Skin    Rashes or ulcers:        Constitutional    Fever or chills:      PHYSICAL EXAM: Vitals:   06/30/21 0837 06/30/21 0842  BP: 119/82 115/77  Pulse: 86 86  Resp: 14   Temp: 98.1 F (36.7 C)   TempSrc: Temporal   SpO2: 95%   Weight: 176 lb (79.8 kg)   Height: 5\' 6"  (1.676 m)     GENERAL: The patient is a well-nourished female, in no acute distress. The vital signs are documented above. CARDIAC: There is a regular rate and rhythm.  VASCULAR:  2+ palpable radial pulse bilateral PULMONARY: No respiratory distress. ABDOMEN: Soft and non-tender. MUSCULOSKELETAL: There are no major deformities or cyanosis. NEUROLOGIC: No focal weakness or paresthesias are detected.  CN II-XII grossly intact. SKIN: There are no ulcers or rashes noted. PSYCHIATRIC: The patient has a normal affect.  DATA:   I independently reviewed her carotid duplex today and it shows 1 to 39% stenosis bilaterally.  Assessment/Plan:  51 year old female presents for 2 year follow-up of her carotid artery disease after calcification was noted on her dental x-rays several year ago.  Carotid duplex today shows no progression of carotid disease with minimal 1 to 39% stenosis bilaterally.  Her carotid  disease remains asymptomatic  with no history of stroke or TIA.  Discussed we would continue surveillance and there is no indication for surgical intervention in the setting of asymptomatic disease unless greater than 80% stenosis.  We will arrange follow-up again in 5 years.  Discussed she call with questions or concerns.   Marty Heck, MD [facility] Office: [phone]

## 2021-09-25 DIAGNOSIS — K648 Other hemorrhoids: Secondary | ICD-10-CM | POA: Diagnosis not present

## 2021-09-25 DIAGNOSIS — D123 Benign neoplasm of transverse colon: Secondary | ICD-10-CM | POA: Diagnosis not present

## 2021-09-25 DIAGNOSIS — Z1211 Encounter for screening for malignant neoplasm of colon: Secondary | ICD-10-CM | POA: Diagnosis not present

## 2021-09-25 DIAGNOSIS — K6389 Other specified diseases of intestine: Secondary | ICD-10-CM | POA: Diagnosis not present

## 2021-09-25 DIAGNOSIS — D124 Benign neoplasm of descending colon: Secondary | ICD-10-CM | POA: Diagnosis not present

## 2021-11-05 DIAGNOSIS — L237 Allergic contact dermatitis due to plants, except food: Secondary | ICD-10-CM | POA: Diagnosis not present

## 2021-11-10 DIAGNOSIS — Z713 Dietary counseling and surveillance: Secondary | ICD-10-CM | POA: Diagnosis not present

## 2021-12-31 DIAGNOSIS — Z86010 Personal history of colonic polyps: Secondary | ICD-10-CM | POA: Diagnosis not present

## 2021-12-31 DIAGNOSIS — D120 Benign neoplasm of cecum: Secondary | ICD-10-CM | POA: Diagnosis not present

## 2021-12-31 DIAGNOSIS — D122 Benign neoplasm of ascending colon: Secondary | ICD-10-CM | POA: Diagnosis not present

## 2021-12-31 DIAGNOSIS — D124 Benign neoplasm of descending colon: Secondary | ICD-10-CM | POA: Diagnosis not present

## 2021-12-31 DIAGNOSIS — K6289 Other specified diseases of anus and rectum: Secondary | ICD-10-CM | POA: Diagnosis not present

## 2021-12-31 DIAGNOSIS — K648 Other hemorrhoids: Secondary | ICD-10-CM | POA: Diagnosis not present

## 2021-12-31 DIAGNOSIS — D123 Benign neoplasm of transverse colon: Secondary | ICD-10-CM | POA: Diagnosis not present

## 2021-12-31 DIAGNOSIS — K6389 Other specified diseases of intestine: Secondary | ICD-10-CM | POA: Diagnosis not present

## 2022-01-07 ENCOUNTER — Other Ambulatory Visit: Payer: Self-pay | Admitting: Gastroenterology

## 2022-01-07 DIAGNOSIS — D121 Benign neoplasm of appendix: Secondary | ICD-10-CM

## 2022-01-20 ENCOUNTER — Other Ambulatory Visit: Payer: Self-pay | Admitting: Gastroenterology

## 2022-01-20 DIAGNOSIS — D121 Benign neoplasm of appendix: Secondary | ICD-10-CM

## 2022-02-15 ENCOUNTER — Ambulatory Visit
Admission: RE | Admit: 2022-02-15 | Discharge: 2022-02-15 | Disposition: A | Discharge status: Home / Self Care | Payer: BC Managed Care – PPO | Source: Ambulatory Visit | Attending: Gastroenterology | Admitting: Gastroenterology

## 2022-02-15 DIAGNOSIS — N3289 Other specified disorders of bladder: Secondary | ICD-10-CM | POA: Diagnosis not present

## 2022-02-15 DIAGNOSIS — K429 Umbilical hernia without obstruction or gangrene: Secondary | ICD-10-CM | POA: Diagnosis not present

## 2022-02-15 DIAGNOSIS — K828 Other specified diseases of gallbladder: Secondary | ICD-10-CM | POA: Diagnosis not present

## 2022-02-15 DIAGNOSIS — C181 Malignant neoplasm of appendix: Secondary | ICD-10-CM | POA: Diagnosis not present

## 2022-02-15 DIAGNOSIS — D121 Benign neoplasm of appendix: Secondary | ICD-10-CM

## 2022-02-15 MED ORDER — IOPAMIDOL (ISOVUE-300) INJECTION 61%
100.0000 mL | Freq: Once | INTRAVENOUS | Status: AC | PRN
  Administered 2022-02-15: 100 mL via INTRAVENOUS

## 2022-03-24 DIAGNOSIS — H9201 Otalgia, right ear: Secondary | ICD-10-CM | POA: Diagnosis not present

## 2022-03-24 DIAGNOSIS — H9190 Unspecified hearing loss, unspecified ear: Secondary | ICD-10-CM | POA: Diagnosis not present

## 2022-05-31 DIAGNOSIS — Z13220 Encounter for screening for lipoid disorders: Secondary | ICD-10-CM | POA: Diagnosis not present

## 2022-05-31 DIAGNOSIS — E559 Vitamin D deficiency, unspecified: Secondary | ICD-10-CM | POA: Diagnosis not present

## 2022-05-31 DIAGNOSIS — Z23 Encounter for immunization: Secondary | ICD-10-CM | POA: Diagnosis not present

## 2022-05-31 DIAGNOSIS — Z0000 Encounter for general adult medical examination without abnormal findings: Secondary | ICD-10-CM | POA: Diagnosis not present

## 2022-05-31 DIAGNOSIS — I6529 Occlusion and stenosis of unspecified carotid artery: Secondary | ICD-10-CM | POA: Diagnosis not present

## 2022-05-31 DIAGNOSIS — I10 Essential (primary) hypertension: Secondary | ICD-10-CM | POA: Diagnosis not present

## 2022-05-31 DIAGNOSIS — F418 Other specified anxiety disorders: Secondary | ICD-10-CM | POA: Diagnosis not present

## 2022-06-02 DIAGNOSIS — Z1389 Encounter for screening for other disorder: Secondary | ICD-10-CM | POA: Diagnosis not present

## 2022-06-02 DIAGNOSIS — I10 Essential (primary) hypertension: Secondary | ICD-10-CM | POA: Diagnosis not present

## 2022-06-02 DIAGNOSIS — E559 Vitamin D deficiency, unspecified: Secondary | ICD-10-CM | POA: Diagnosis not present

## 2022-06-02 DIAGNOSIS — E8889 Other specified metabolic disorders: Secondary | ICD-10-CM | POA: Diagnosis not present

## 2022-06-02 DIAGNOSIS — R7309 Other abnormal glucose: Secondary | ICD-10-CM | POA: Diagnosis not present

## 2022-06-16 DIAGNOSIS — E663 Overweight: Secondary | ICD-10-CM | POA: Diagnosis not present

## 2022-06-16 DIAGNOSIS — I10 Essential (primary) hypertension: Secondary | ICD-10-CM | POA: Diagnosis not present

## 2022-06-16 DIAGNOSIS — Z6829 Body mass index (BMI) 29.0-29.9, adult: Secondary | ICD-10-CM | POA: Diagnosis not present

## 2022-06-16 DIAGNOSIS — Z9189 Other specified personal risk factors, not elsewhere classified: Secondary | ICD-10-CM | POA: Diagnosis not present

## 2022-06-30 DIAGNOSIS — Z9189 Other specified personal risk factors, not elsewhere classified: Secondary | ICD-10-CM | POA: Diagnosis not present

## 2022-06-30 DIAGNOSIS — I10 Essential (primary) hypertension: Secondary | ICD-10-CM | POA: Diagnosis not present

## 2022-06-30 DIAGNOSIS — E663 Overweight: Secondary | ICD-10-CM | POA: Diagnosis not present

## 2022-07-29 DIAGNOSIS — I10 Essential (primary) hypertension: Secondary | ICD-10-CM | POA: Diagnosis not present

## 2022-07-30 DIAGNOSIS — H00021 Hordeolum internum right upper eyelid: Secondary | ICD-10-CM | POA: Diagnosis not present

## 2022-08-10 DIAGNOSIS — H00021 Hordeolum internum right upper eyelid: Secondary | ICD-10-CM | POA: Diagnosis not present

## 2022-08-12 DIAGNOSIS — H02881 Meibomian gland dysfunction right upper eyelid: Secondary | ICD-10-CM | POA: Diagnosis not present

## 2022-08-12 DIAGNOSIS — H00021 Hordeolum internum right upper eyelid: Secondary | ICD-10-CM | POA: Diagnosis not present

## 2022-09-09 DIAGNOSIS — E663 Overweight: Secondary | ICD-10-CM | POA: Diagnosis not present

## 2022-09-09 DIAGNOSIS — I10 Essential (primary) hypertension: Secondary | ICD-10-CM | POA: Diagnosis not present

## 2022-10-13 DIAGNOSIS — E663 Overweight: Secondary | ICD-10-CM | POA: Diagnosis not present

## 2022-10-13 DIAGNOSIS — I10 Essential (primary) hypertension: Secondary | ICD-10-CM | POA: Diagnosis not present

## 2022-11-10 DIAGNOSIS — I10 Essential (primary) hypertension: Secondary | ICD-10-CM | POA: Diagnosis not present

## 2022-12-21 DIAGNOSIS — I10 Essential (primary) hypertension: Secondary | ICD-10-CM | POA: Diagnosis not present

## 2022-12-30 DIAGNOSIS — H00011 Hordeolum externum right upper eyelid: Secondary | ICD-10-CM | POA: Diagnosis not present

## 2023-02-01 DIAGNOSIS — I10 Essential (primary) hypertension: Secondary | ICD-10-CM | POA: Diagnosis not present

## 2023-02-07 DIAGNOSIS — H04123 Dry eye syndrome of bilateral lacrimal glands: Secondary | ICD-10-CM | POA: Diagnosis not present

## 2023-02-07 DIAGNOSIS — H40013 Open angle with borderline findings, low risk, bilateral: Secondary | ICD-10-CM | POA: Diagnosis not present

## 2023-02-07 DIAGNOSIS — H00021 Hordeolum internum right upper eyelid: Secondary | ICD-10-CM | POA: Diagnosis not present

## 2023-02-07 DIAGNOSIS — H02881 Meibomian gland dysfunction right upper eyelid: Secondary | ICD-10-CM | POA: Diagnosis not present

## 2023-03-11 DIAGNOSIS — R92333 Mammographic heterogeneous density, bilateral breasts: Secondary | ICD-10-CM | POA: Diagnosis not present

## 2023-03-11 DIAGNOSIS — Z1231 Encounter for screening mammogram for malignant neoplasm of breast: Secondary | ICD-10-CM | POA: Diagnosis not present

## 2023-03-15 DIAGNOSIS — I10 Essential (primary) hypertension: Secondary | ICD-10-CM | POA: Diagnosis not present

## 2023-05-10 DIAGNOSIS — I10 Essential (primary) hypertension: Secondary | ICD-10-CM | POA: Diagnosis not present

## 2023-06-06 DIAGNOSIS — L918 Other hypertrophic disorders of the skin: Secondary | ICD-10-CM | POA: Diagnosis not present

## 2023-06-06 DIAGNOSIS — E559 Vitamin D deficiency, unspecified: Secondary | ICD-10-CM | POA: Diagnosis not present

## 2023-06-06 DIAGNOSIS — Z23 Encounter for immunization: Secondary | ICD-10-CM | POA: Diagnosis not present

## 2023-06-06 DIAGNOSIS — F418 Other specified anxiety disorders: Secondary | ICD-10-CM | POA: Diagnosis not present

## 2023-06-06 DIAGNOSIS — I10 Essential (primary) hypertension: Secondary | ICD-10-CM | POA: Diagnosis not present

## 2023-06-06 DIAGNOSIS — Z0000 Encounter for general adult medical examination without abnormal findings: Secondary | ICD-10-CM | POA: Diagnosis not present

## 2023-07-05 DIAGNOSIS — I10 Essential (primary) hypertension: Secondary | ICD-10-CM | POA: Diagnosis not present

## 2023-09-01 DIAGNOSIS — I10 Essential (primary) hypertension: Secondary | ICD-10-CM | POA: Diagnosis not present

## 2023-09-01 DIAGNOSIS — Z8639 Personal history of other endocrine, nutritional and metabolic disease: Secondary | ICD-10-CM | POA: Diagnosis not present

## 2023-09-01 DIAGNOSIS — Z6824 Body mass index (BMI) 24.0-24.9, adult: Secondary | ICD-10-CM | POA: Diagnosis not present

## 2023-11-03 DIAGNOSIS — I10 Essential (primary) hypertension: Secondary | ICD-10-CM | POA: Diagnosis not present

## 2023-11-03 DIAGNOSIS — Z6824 Body mass index (BMI) 24.0-24.9, adult: Secondary | ICD-10-CM | POA: Diagnosis not present

## 2023-11-03 DIAGNOSIS — Z8639 Personal history of other endocrine, nutritional and metabolic disease: Secondary | ICD-10-CM | POA: Diagnosis not present

## 2024-01-16 DIAGNOSIS — Z8639 Personal history of other endocrine, nutritional and metabolic disease: Secondary | ICD-10-CM | POA: Diagnosis not present

## 2024-01-16 DIAGNOSIS — G4733 Obstructive sleep apnea (adult) (pediatric): Secondary | ICD-10-CM | POA: Diagnosis not present

## 2024-01-16 DIAGNOSIS — I10 Essential (primary) hypertension: Secondary | ICD-10-CM | POA: Diagnosis not present

## 2024-01-16 DIAGNOSIS — E785 Hyperlipidemia, unspecified: Secondary | ICD-10-CM | POA: Diagnosis not present

## 2024-03-13 DIAGNOSIS — R92333 Mammographic heterogeneous density, bilateral breasts: Secondary | ICD-10-CM | POA: Diagnosis not present

## 2024-03-13 DIAGNOSIS — Z1231 Encounter for screening mammogram for malignant neoplasm of breast: Secondary | ICD-10-CM | POA: Diagnosis not present

## 2024-03-27 DIAGNOSIS — Z8639 Personal history of other endocrine, nutritional and metabolic disease: Secondary | ICD-10-CM | POA: Diagnosis not present

## 2024-03-27 DIAGNOSIS — G4733 Obstructive sleep apnea (adult) (pediatric): Secondary | ICD-10-CM | POA: Diagnosis not present

## 2024-03-27 DIAGNOSIS — E785 Hyperlipidemia, unspecified: Secondary | ICD-10-CM | POA: Diagnosis not present

## 2024-03-27 DIAGNOSIS — I10 Essential (primary) hypertension: Secondary | ICD-10-CM | POA: Diagnosis not present

## 2024-05-05 DIAGNOSIS — J102 Influenza due to other identified influenza virus with gastrointestinal manifestations: Secondary | ICD-10-CM | POA: Diagnosis not present

## 2024-05-05 DIAGNOSIS — U071 COVID-19: Secondary | ICD-10-CM | POA: Diagnosis not present

## 2024-05-05 DIAGNOSIS — R059 Cough, unspecified: Secondary | ICD-10-CM | POA: Diagnosis not present
